# Patient Record
Sex: Male | Born: 1940 | Race: White | Hispanic: No | Marital: Married | Smoking: Never smoker
Health system: Southern US, Community
[De-identification: ages and names within clinical notes are randomized; demographics above are authoritative.]

## PROBLEM LIST (undated history)

## (undated) DIAGNOSIS — M1990 Unspecified osteoarthritis, unspecified site: Secondary | ICD-10-CM

## (undated) DIAGNOSIS — I82402 Acute embolism and thrombosis of unspecified deep veins of left lower extremity: Secondary | ICD-10-CM

## (undated) DIAGNOSIS — N200 Calculus of kidney: Secondary | ICD-10-CM

## (undated) DIAGNOSIS — K219 Gastro-esophageal reflux disease without esophagitis: Secondary | ICD-10-CM

## (undated) DIAGNOSIS — I10 Essential (primary) hypertension: Secondary | ICD-10-CM

## (undated) DIAGNOSIS — I2699 Other pulmonary embolism without acute cor pulmonale: Secondary | ICD-10-CM

## (undated) DIAGNOSIS — K635 Polyp of colon: Secondary | ICD-10-CM

## (undated) HISTORY — DX: Acute embolism and thrombosis of unspecified deep veins of left lower extremity: I82.402

## (undated) HISTORY — PX: COLONOSCOPY: SHX174

## (undated) HISTORY — DX: Other pulmonary embolism without acute cor pulmonale: I26.99

## (undated) HISTORY — PX: LITHOTRIPSY: SUR834

---

## 2005-01-28 ENCOUNTER — Other Ambulatory Visit: Admission: RE | Admit: 2005-01-28 | Discharge: 2005-01-28 | Payer: Self-pay | Admitting: Ophthalmology

## 2007-01-13 ENCOUNTER — Ambulatory Visit: Payer: Self-pay | Admitting: Cardiology

## 2009-11-10 ENCOUNTER — Ambulatory Visit (HOSPITAL_BASED_OUTPATIENT_CLINIC_OR_DEPARTMENT_OTHER): Admission: RE | Admit: 2009-11-10 | Discharge: 2009-11-11 | Payer: Self-pay | Admitting: Urology

## 2010-10-16 LAB — CBC
Hemoglobin: 15.2 g/dL (ref 13.0–17.0)
MCHC: 33.6 g/dL (ref 30.0–36.0)
Platelets: 162 10*3/uL (ref 150–400)

## 2010-10-16 LAB — BASIC METABOLIC PANEL
BUN: 12 mg/dL (ref 6–23)
Calcium: 9.4 mg/dL (ref 8.4–10.5)
Creatinine, Ser: 1.12 mg/dL (ref 0.4–1.5)
GFR calc non Af Amer: >60 mL/min (ref >60)
Potassium: 4.1 mEq/L (ref 3.5–5.1)
Sodium: 137 mEq/L (ref 135–145)

## 2010-10-16 LAB — PROTIME-INR
INR: 1.02 (ref 0.00–1.49)
Prothrombin Time: 13.3 seconds (ref 11.6–15.2)

## 2010-10-16 LAB — URINALYSIS, MICROSCOPIC ONLY
Bilirubin Urine: NEGATIVE
Glucose, UA: NEGATIVE mg/dL
Nitrite: NEGATIVE
Protein, ur: NEGATIVE mg/dL
Specific Gravity, Urine: 1.023 (ref 1.005–1.030)
Urobilinogen, UA: 1 mg/dL (ref 0.0–1.0)
pH: 5.5 (ref 5.0–8.0)

## 2010-10-16 LAB — COMPREHENSIVE METABOLIC PANEL
AST: 19 U/L (ref 0–37)
Calcium: 9.4 mg/dL (ref 8.4–10.5)
Creatinine, Ser: 1.21 mg/dL (ref 0.4–1.5)
GFR calc Af Amer: >60 mL/min (ref >60)
GFR calc non Af Amer: 60 mL/min — ABNORMAL LOW (ref >60)
Potassium: 3.8 mEq/L (ref 3.5–5.1)
Total Bilirubin: 0.8 mg/dL (ref 0.3–1.2)

## 2010-10-16 LAB — HEMOGLOBIN AND HEMATOCRIT, BLOOD: HCT: 43.1 % (ref 39.0–52.0)

## 2011-03-05 DIAGNOSIS — C4491 Basal cell carcinoma of skin, unspecified: Secondary | ICD-10-CM

## 2011-03-05 HISTORY — DX: Basal cell carcinoma of skin, unspecified: C44.91

## 2011-08-06 DIAGNOSIS — R5383 Other fatigue: Secondary | ICD-10-CM | POA: Diagnosis not present

## 2011-08-06 DIAGNOSIS — E782 Mixed hyperlipidemia: Secondary | ICD-10-CM | POA: Diagnosis not present

## 2011-08-06 DIAGNOSIS — N400 Enlarged prostate without lower urinary tract symptoms: Secondary | ICD-10-CM | POA: Diagnosis not present

## 2011-08-13 DIAGNOSIS — Z0000 Encounter for general adult medical examination without abnormal findings: Secondary | ICD-10-CM | POA: Diagnosis not present

## 2011-08-15 ENCOUNTER — Encounter (INDEPENDENT_AMBULATORY_CARE_PROVIDER_SITE_OTHER): Payer: Self-pay | Admitting: *Deleted

## 2011-08-22 ENCOUNTER — Other Ambulatory Visit (INDEPENDENT_AMBULATORY_CARE_PROVIDER_SITE_OTHER): Payer: Self-pay | Admitting: *Deleted

## 2011-08-22 ENCOUNTER — Encounter (INDEPENDENT_AMBULATORY_CARE_PROVIDER_SITE_OTHER): Payer: Self-pay | Admitting: *Deleted

## 2011-08-22 DIAGNOSIS — Z1211 Encounter for screening for malignant neoplasm of colon: Secondary | ICD-10-CM

## 2011-08-22 NOTE — Telephone Encounter (Signed)
This encounter was created in error - please disregard.

## 2011-09-16 DIAGNOSIS — C44319 Basal cell carcinoma of skin of other parts of face: Secondary | ICD-10-CM | POA: Diagnosis not present

## 2011-09-16 DIAGNOSIS — C44611 Basal cell carcinoma of skin of unspecified upper limb, including shoulder: Secondary | ICD-10-CM | POA: Diagnosis not present

## 2011-09-25 ENCOUNTER — Telehealth (INDEPENDENT_AMBULATORY_CARE_PROVIDER_SITE_OTHER): Payer: Self-pay | Admitting: *Deleted

## 2011-09-25 NOTE — Telephone Encounter (Signed)
Requesting MD/PCP:  daniel     Name & DOB: Ronald Valentine  2041/02/13     Procedure: TCS  Reason/Indication:  screening  Has patient had this procedure before?  no  If so, when, by whom and where?    Is there a family history of colon cancer?  no  Who?  What age when diagnosed?    Is patient diabetic?   no      Does patient have prosthetic heart valve?  no  Do you have a pacemaker?  no  Has patient had joint replacement within last 12 months?  no  Is patient on Coumadin, Plavix and/or Aspirin? yes  Medications: asa 81 mg daily, verapamil 240 mg daily, lisinopril/HCTZ 20/12.5 mg daily, omeprazole 20 mg daily, centrum silver, fiber, fish oil, meloxicam 15 mg daily  Allergies: nkda  Pharmacy:   Medication Adjustment:    Procedure date & time: 10/17/11 at 930

## 2011-09-25 NOTE — Telephone Encounter (Signed)
Agree.   Needs to hold ASA

## 2011-10-16 MED ORDER — SODIUM CHLORIDE 0.45 % IV SOLN
Freq: Once | INTRAVENOUS | Status: AC
  Administered 2011-10-17: 09:00:00 via INTRAVENOUS

## 2011-10-17 ENCOUNTER — Encounter (HOSPITAL_COMMUNITY)
Admission: RE | Disposition: A | Discharge status: Home / Self Care | Payer: Self-pay | Source: Ambulatory Visit | Attending: Internal Medicine

## 2011-10-17 ENCOUNTER — Encounter (HOSPITAL_COMMUNITY): Payer: Self-pay | Admitting: *Deleted

## 2011-10-17 ENCOUNTER — Ambulatory Visit (HOSPITAL_COMMUNITY)
Admission: RE | Admit: 2011-10-17 | Discharge: 2011-10-17 | Disposition: A | Discharge status: Home / Self Care | Payer: Medicare Other | Source: Ambulatory Visit | Attending: Internal Medicine | Admitting: Internal Medicine

## 2011-10-17 DIAGNOSIS — D126 Benign neoplasm of colon, unspecified: Secondary | ICD-10-CM | POA: Diagnosis not present

## 2011-10-17 DIAGNOSIS — K644 Residual hemorrhoidal skin tags: Secondary | ICD-10-CM | POA: Insufficient documentation

## 2011-10-17 DIAGNOSIS — I10 Essential (primary) hypertension: Secondary | ICD-10-CM | POA: Insufficient documentation

## 2011-10-17 DIAGNOSIS — Z1211 Encounter for screening for malignant neoplasm of colon: Secondary | ICD-10-CM

## 2011-10-17 DIAGNOSIS — K5730 Diverticulosis of large intestine without perforation or abscess without bleeding: Secondary | ICD-10-CM | POA: Diagnosis not present

## 2011-10-17 DIAGNOSIS — Z79899 Other long term (current) drug therapy: Secondary | ICD-10-CM | POA: Insufficient documentation

## 2011-10-17 DIAGNOSIS — Z860100 Personal history of colon polyps, unspecified: Secondary | ICD-10-CM | POA: Insufficient documentation

## 2011-10-17 DIAGNOSIS — Z7982 Long term (current) use of aspirin: Secondary | ICD-10-CM | POA: Diagnosis not present

## 2011-10-17 HISTORY — DX: Unspecified osteoarthritis, unspecified site: M19.90

## 2011-10-17 HISTORY — DX: Essential (primary) hypertension: I10

## 2011-10-17 HISTORY — DX: Polyp of colon: K63.5

## 2011-10-17 HISTORY — DX: Gastro-esophageal reflux disease without esophagitis: K21.9

## 2011-10-17 HISTORY — PX: COLONOSCOPY: SHX5424

## 2011-10-17 HISTORY — DX: Calculus of kidney: N20.0

## 2011-10-17 SURGERY — COLONOSCOPY
Anesthesia: Moderate Sedation

## 2011-10-17 MED ORDER — MIDAZOLAM HCL 5 MG/5ML IJ SOLN
INTRAMUSCULAR | Status: AC
  Filled 2011-10-17: qty 5

## 2011-10-17 MED ORDER — MEPERIDINE HCL 50 MG/ML IJ SOLN
INTRAMUSCULAR | Status: AC
  Filled 2011-10-17: qty 1

## 2011-10-17 MED ORDER — MEPERIDINE HCL 50 MG/ML IJ SOLN
INTRAMUSCULAR | Status: DC | PRN
  Administered 2011-10-17 (×2): 25 mg via INTRAVENOUS

## 2011-10-17 MED ORDER — MIDAZOLAM HCL 5 MG/5ML IJ SOLN
INTRAMUSCULAR | Status: DC | PRN
  Administered 2011-10-17 (×3): 2 mg via INTRAVENOUS

## 2011-10-17 NOTE — Op Note (Signed)
COLONOSCOPY PROCEDURE REPORT  PATIENT:  Ronald Valentine  MR#:  [id number] Birthdate:  January 30, 1941, 71 y.o., male Endoscopist:  Dr. Malissa Hippo, MD Referred By:  Dr. Donzetta Sprung, MD Procedure Date: 10/17/2011  Procedure:   Colonoscopy with snare polypectomy.  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 His last exam 5 years ago elsewhere was normal.  Informed Consent:  The procedure and risks were reviewed with the patient and informed consent was obtained.  Medications:  Demerol 50 mg IV Versed 6 mg IV  Description of procedure:  After a digital rectal exam was performed, that colonoscope was advanced from the anus through the rectum and colon to the area of the cecum, ileocecal valve and appendiceal orifice. The cecum was deeply intubated. These structures were well-seen and photographed for the record. From the level of the cecum and ileocecal valve, the scope was slowly and cautiously withdrawn. The mucosal surfaces were carefully surveyed utilizing scope tip to flexion to facilitate fold flattening as needed. The scope was pulled down into the rectum where a thorough exam including retroflexion was performed.  Findings:   Prep satisfactory. Moderate number of diverticula and sigmoid colon but few descending colon. 6 mm polyp snared from distal transverse colon. Small linear scar proximal to the dentate line. Small hemorrhoids below the dentate line.  Therapeutic/Diagnostic Maneuvers Performed:  See above  Complications:  None  Cecal Withdrawal Time:  10 minutes  Impression:  Examination performed to cecum. Left-sided diverticulosis. 6 mm polyp snared from distal transverse colon. External hemorrhoids  Recommendations:  Standard instructions given. No aspirin for one week. I will contact patient with biopsy results.  Seniya Stoffers U  10/17/2011 9:56 AM  CC: Dr. Donzetta Sprung, MD, MD & Dr. Bonnetta Barry ref. provider  found

## 2011-10-17 NOTE — Discharge Instructions (Signed)
No aspirin or meloxicam for one week. Resume medications as before. High fiber diet. No driving for 24 hours. Physician will contact him with biopsy results.Colon Polyps A polyp is extra tissue that grows inside your body. Colon polyps grow in the large intestine. The large intestine, also called the colon, is part of your digestive system. It is a long, hollow tube at the end of your digestive tract where your body makes and stores stool. Most polyps are not dangerous. They are benign. This means they are not cancerous. But over time, some types of polyps can turn into cancer. Polyps that are smaller than a pea are usually not harmful. But larger polyps could someday become or may already be cancerous. To be safe, doctors remove all polyps and test them.  WHO GETS POLYPS? Anyone can get polyps, but certain people are more likely than others. You may have a greater chance of getting polyps if:  You are over 50.   You have had polyps before.   Someone in your family has had polyps.   Someone in your family has had cancer of the large intestine.   Find out if someone in your family has had polyps. You may also be more likely to get polyps if you:   Eat a lot of fatty foods.   Smoke.   Drink alcohol.   Do not exercise.   Eat too much.  SYMPTOMS  Most small polyps do not cause symptoms. People often do not know they have one until their caregiver finds it during a regular checkup or while testing them for something else. Some people do have symptoms like these:  Bleeding from the anus. You might notice blood on your underwear or on toilet paper after you have had a bowel movement.   Constipation or diarrhea that lasts more than a week.   Blood in the stool. Blood can make stool look black or it can show up as red streaks in the stool.  If you have any of these symptoms, see your caregiver. HOW DOES THE DOCTOR TEST FOR POLYPS? The doctor can use four tests to check for  polyps:  Digital rectal exam. The caregiver wears gloves and checks your rectum (the last part of the large intestine) to see if it feels normal. This test would find polyps only in the rectum. Your caregiver may need to do one of the other tests listed below to find polyps higher up in the intestine.   Barium enema. The caregiver puts a liquid called barium into your rectum before taking x-rays of your large intestine. Barium makes your intestine look white in the pictures. Polyps are dark, so they are easy to see.   Sigmoidoscopy. With this test, the caregiver can see inside your large intestine. A thin flexible tube is placed into your rectum. The device is called a sigmoidoscope, which has a light and a tiny video camera in it. The caregiver uses the sigmoidoscope to look at the last third of your large intestine.   Colonoscopy. This test is like sigmoidoscopy, but the caregiver looks at all of the large intestine. It usually requires sedation. This is the most common method for finding and removing polyps.  TREATMENT   The caregiver will remove the polyp during sigmoidoscopy or colonoscopy. The polyp is then tested for cancer.   If you have had polyps, your caregiver may want you to get tested regularly in the future.  PREVENTION  There is not one sure way  to prevent polyps. You might be able to lower your risk of getting them if you:  Eat more fruits and vegetables and less fatty food.   Do not smoke.   Avoid alcohol.   Exercise every day.   Lose weight if you are overweight.   Eating more calcium and folate can also lower your risk of getting polyps. Some foods that are rich in calcium are milk, cheese, and broccoli. Some foods that are rich in folate are chickpeas, kidney beans, and spinach.   Aspirin might help prevent polyps. Studies are under way.  Document Released: 04/10/2004 Document Revised: 07/04/2011 Document Reviewed: 09/16/2007 [facility] Patient Information 2012  [place], [place].Colonoscopy Care After Read the instructions outlined below and refer to this sheet in the next few weeks. These discharge instructions provide you with general information on caring for yourself after you leave the hospital. Your doctor may also give you specific instructions. While your treatment has been planned according to the most current medical practices available, unavoidable complications occasionally occur. If you have any problems or questions after discharge, call your doctor. HOME CARE INSTRUCTIONS ACTIVITY:  You may resume your regular activity, but move at a slower pace for the next 24 hours.   Take frequent rest periods for the next 24 hours.   Walking will help get rid of the air and reduce the bloated feeling in your belly (abdomen).   No driving for 24 hours (because of the medicine (anesthesia) used during the test).   You may shower.   Do not sign any important legal documents or operate any machinery for 24 hours (because of the anesthesia used during the test).  NUTRITION:  Drink plenty of fluids.   You may resume your normal diet as instructed by your doctor.   Begin with a light meal and progress to your normal diet. Heavy or fried foods are harder to digest and may make you feel sick to your stomach (nauseated).   Avoid alcoholic beverages for 24 hours or as instructed.  MEDICATIONS:  You may resume your normal medications unless your doctor tells you otherwise.  WHAT TO EXPECT TODAY:  Some feelings of bloating in the abdomen.   Passage of more gas than usual.   Spotting of blood in your stool or on the toilet paper.  IF YOU HAD POLYPS REMOVED DURING THE COLONOSCOPY:  No aspirin products for 7 days or as instructed.   No alcohol for 7 days or as instructed.   Eat a soft diet for the next 24 hours.  FINDING OUT THE RESULTS OF YOUR TEST Not all test results are available during your visit. If your test results are not back during the  visit, make an appointment with your caregiver to find out the results. Do not assume everything is normal if you have not heard from your caregiver or the medical facility. It is important for you to follow up on all of your test results.  SEEK IMMEDIATE MEDICAL CARE IF:  You have more than a spotting of blood in your stool.   Your belly is swollen (abdominal distention).   You are nauseated or vomiting.   You have a fever.   You have abdominal pain or discomfort that is severe or gets worse throughout the day.  Document Released: 02/27/2004 Document Revised: 07/04/2011 Document Reviewed: 02/25/2008 [facility] Patient Information 2012 [place], [place].Hemorrhoids Hemorrhoids are enlarged (dilated) veins around the rectum. There are 2 types of hemorrhoids, and the type of hemorrhoid is determined  by its location. Internal hemorrhoids occur in the veins just inside the rectum.They are usually not painful, but they may bleed.However, they may poke through to the outside and become irritated and painful. External hemorrhoids involve the veins outside the anus and can be felt as a painful swelling or hard lump near the anus.They are often itchy and may crack and bleed. Sometimes clots will form in the veins. This makes them swollen and painful. These are called thrombosed hemorrhoids. CAUSES Causes of hemorrhoids include:  Pregnancy. This increases the pressure in the hemorrhoidal veins.   Constipation.   Straining to have a bowel movement.   Obesity.   Heavy lifting or other activity that caused you to strain.  TREATMENT Most of the time hemorrhoids improve in 1 to 2 weeks. However, if symptoms do not seem to be getting better or if you have a lot of rectal bleeding, your caregiver may perform a procedure to help make the hemorrhoids get smaller or remove them completely.Possible treatments include:  Rubber band ligation. A rubber band is placed at the base of the hemorrhoid to cut off  the circulation.   Sclerotherapy. A chemical is injected to shrink the hemorrhoid.   Infrared light therapy. Tools are used to burn the hemorrhoid.   Hemorrhoidectomy. This is surgical removal of the hemorrhoid.  HOME CARE INSTRUCTIONS   Increase fiber in your diet. Ask your caregiver about using fiber supplements.   Drink enough water and fluids to keep your urine clear or pale yellow.   Exercise regularly.   Go to the bathroom when you have the urge to have a bowel movement. Do not wait.   Avoid straining to have bowel movements.   Keep the anal area dry and clean.   Only take over-the-counter or prescription medicines for pain, discomfort, or fever as directed by your caregiver.  If your hemorrhoids are thrombosed:  Take warm sitz baths for 20 to 30 minutes, 3 to 4 times per day.   If the hemorrhoids are very tender and swollen, place ice packs on the area as tolerated. Using ice packs between sitz baths may be helpful. Fill a plastic bag with ice. Place a towel between the bag of ice and your skin.   Medicated creams and suppositories may be used or applied as directed.   Do not use a donut-shaped pillow or sit on the toilet for long periods. This increases blood pooling and pain.  SEEK MEDICAL CARE IF:   You have increasing pain and swelling that is not controlled with your medicine.   You have uncontrolled bleeding.   You have difficulty or you are unable to have a bowel movement.   You have pain or inflammation outside the area of the hemorrhoids.   You have chills or an oral temperature above 102 F (38.9 C).  MAKE SURE YOU:   Understand these instructions.   Will watch your condition.   Will get help right away if you are not doing well or get worse.  Document Released: 07/12/2000 Document Revised: 07/04/2011 Document Reviewed: 11/17/2007 [facility] Patient Information 2012 [place], [place].

## 2011-10-17 NOTE — H&P (Signed)
Ronald Valentine is an 71 y.o. male.   Chief Complaint: Patient is here for colonoscopy. 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 His last exam was 5 years [facility] in [place] and reportedly was normal. He is here for surveillance colonoscopy. He denies melena or rectal bleeding abdominal pain or change in his bowel habits. History is negative for colorectal carcinoma   Past Medical History  Diagnosis Date  . Hypertension   . GERD (gastroesophageal reflux disease)   . Arthritis   . Colon polyp   . Kidney stones     Past Surgical History  Procedure Date  . Colonoscopy   . Lithotripsy     Family History  Problem Relation Age of Onset  . Colon cancer Neg Hx    Social History:  reports that he has never smoked. He does not have any smokeless tobacco history on file. He reports that he does not drink alcohol or use illicit drugs.  Allergies: No Known Allergies  Medications Prior to Admission  Medication Dose Route Frequency Provider Last Rate Last Dose  . 0.45 % sodium chloride infusion   Intravenous Once Malissa Hippo, MD 20 mL/hr at 10/17/11 [phone]    . meperidine (DEMEROL) 50 MG/ML injection           . midazolam (VERSED) 5 MG/5ML injection            Medications Prior to Admission  Medication Sig Dispense Refill  . aspirin 81 MG tablet Take 81 mg by mouth daily.      Marland Kitchen lisinopril-hydrochlorothiazide (PRINZIDE,ZESTORETIC) 20-12.5 MG per tablet Take 1 tablet by mouth daily.      . meloxicam (MOBIC) 15 MG tablet Take 15 mg by mouth daily.      . Multiple Vitamins-Minerals (CENTRUM SILVER) tablet Take 1 tablet by mouth daily.      . Omega-3 Fatty Acids (FISH OIL) 1200 MG CAPS Take 1 capsule by mouth daily.      Marland Kitchen omeprazole (PRILOSEC) 20 MG capsule Take 20 mg by mouth daily.      . verapamil (CALAN-SR) 240 MG CR tablet Take 240 mg by mouth daily.        No results found for this or any previous visit (from the past  48 hour(s)). No results found.  Review of Systems  Constitutional: Negative for weight loss.  Gastrointestinal: Negative for abdominal pain, diarrhea, constipation, blood in stool and melena.    Blood pressure 146/79, pulse 73, temperature 97.4 F (36.3 C), temperature source Oral, resp. rate 21, height 6\' 2"  (1.88 m), weight 218 lb (98.884 kg), SpO2 96.00%. Physical Exam  Constitutional: He appears well-developed and well-nourished.  HENT:  Mouth/Throat: Oropharynx is clear and moist.  Eyes: Conjunctivae are normal. No scleral icterus.  Neck: No thyromegaly present.  Cardiovascular: Normal rate, regular rhythm and normal heart sounds.   No murmur heard. Respiratory: Effort normal and breath sounds normal.  GI: Soft. He exhibits no distension and no mass. There is no tenderness.  Musculoskeletal: He exhibits no edema.  Lymphadenopathy:    He has no cervical adenopathy.  Neurological: He is alert.  Skin: Skin is warm.     Assessment/Plan History of colonic polyps. Surveillance colonoscopy  Ronald Valentine U 10/17/2011, 9:25 AM

## 2011-10-21 ENCOUNTER — Encounter (HOSPITAL_COMMUNITY): Payer: Self-pay | Admitting: Internal Medicine

## 2011-10-24 ENCOUNTER — Encounter (INDEPENDENT_AMBULATORY_CARE_PROVIDER_SITE_OTHER): Payer: Self-pay | Admitting: *Deleted

## 2011-10-31 ENCOUNTER — Other Ambulatory Visit (INDEPENDENT_AMBULATORY_CARE_PROVIDER_SITE_OTHER): Payer: Self-pay | Admitting: *Deleted

## 2011-10-31 ENCOUNTER — Encounter (INDEPENDENT_AMBULATORY_CARE_PROVIDER_SITE_OTHER): Payer: Self-pay | Admitting: *Deleted

## 2011-10-31 DIAGNOSIS — D132 Benign neoplasm of duodenum: Secondary | ICD-10-CM

## 2011-11-25 DIAGNOSIS — C44319 Basal cell carcinoma of skin of other parts of face: Secondary | ICD-10-CM | POA: Diagnosis not present

## 2011-11-27 ENCOUNTER — Telehealth (INDEPENDENT_AMBULATORY_CARE_PROVIDER_SITE_OTHER): Payer: Self-pay | Admitting: *Deleted

## 2011-11-27 NOTE — Telephone Encounter (Signed)
PCP/Requesting MD: daniel  Name & DOB: Ronald Valentine [birth year]     Procedure: egd  Reason/Indication:  Duodenal adenoma  Has patient had this procedure before?  yes  If so, when, by whom and where?  6/11  Is there a family history of colon cancer?  no  Who?  What age when diagnosed?    Is patient diabetic?   no      Does patient have prosthetic heart valve?  no  Do you have a pacemaker?  no  Has patient had joint replacement within last 12 months?  no  Is patient on Coumadin, Plavix and/or Aspirin? yes  Medications: see EPIC  Allergies: nkda  Medication Adjustment: asa 2 dys  Procedure date & time: 12/26/11

## 2011-11-28 NOTE — Telephone Encounter (Signed)
agree

## 2011-12-05 DIAGNOSIS — E7800 Pure hypercholesterolemia, unspecified: Secondary | ICD-10-CM | POA: Diagnosis not present

## 2011-12-05 DIAGNOSIS — Z79899 Other long term (current) drug therapy: Secondary | ICD-10-CM | POA: Diagnosis not present

## 2011-12-10 DIAGNOSIS — E782 Mixed hyperlipidemia: Secondary | ICD-10-CM | POA: Diagnosis not present

## 2011-12-10 DIAGNOSIS — I10 Essential (primary) hypertension: Secondary | ICD-10-CM | POA: Diagnosis not present

## 2011-12-10 DIAGNOSIS — N529 Male erectile dysfunction, unspecified: Secondary | ICD-10-CM | POA: Diagnosis not present

## 2011-12-10 DIAGNOSIS — N200 Calculus of kidney: Secondary | ICD-10-CM | POA: Diagnosis not present

## 2011-12-10 DIAGNOSIS — M1990 Unspecified osteoarthritis, unspecified site: Secondary | ICD-10-CM | POA: Diagnosis not present

## 2011-12-10 DIAGNOSIS — I80299 Phlebitis and thrombophlebitis of other deep vessels of unspecified lower extremity: Secondary | ICD-10-CM | POA: Diagnosis not present

## 2011-12-24 ENCOUNTER — Encounter (HOSPITAL_COMMUNITY): Payer: Self-pay | Admitting: Pharmacy Technician

## 2011-12-25 MED ORDER — SODIUM CHLORIDE 0.45 % IV SOLN
Freq: Once | INTRAVENOUS | Status: AC
  Administered 2011-12-26: 1000 mL via INTRAVENOUS

## 2011-12-26 ENCOUNTER — Telehealth (INDEPENDENT_AMBULATORY_CARE_PROVIDER_SITE_OTHER): Payer: Self-pay | Admitting: *Deleted

## 2011-12-26 ENCOUNTER — Encounter (HOSPITAL_COMMUNITY)
Admission: RE | Disposition: A | Discharge status: Home / Self Care | Payer: Self-pay | Source: Ambulatory Visit | Attending: Internal Medicine

## 2011-12-26 ENCOUNTER — Ambulatory Visit (HOSPITAL_COMMUNITY)
Admission: RE | Admit: 2011-12-26 | Discharge: 2011-12-26 | Disposition: A | Discharge status: Home / Self Care | Payer: Medicare Other | Source: Ambulatory Visit | Attending: Internal Medicine | Admitting: Internal Medicine

## 2011-12-26 ENCOUNTER — Encounter (HOSPITAL_COMMUNITY): Payer: Self-pay | Admitting: *Deleted

## 2011-12-26 DIAGNOSIS — Z860100 Personal history of colon polyps, unspecified: Secondary | ICD-10-CM | POA: Insufficient documentation

## 2011-12-26 DIAGNOSIS — K2960 Other gastritis without bleeding: Secondary | ICD-10-CM

## 2011-12-26 DIAGNOSIS — Z79899 Other long term (current) drug therapy: Secondary | ICD-10-CM | POA: Diagnosis not present

## 2011-12-26 DIAGNOSIS — I10 Essential (primary) hypertension: Secondary | ICD-10-CM | POA: Insufficient documentation

## 2011-12-26 DIAGNOSIS — K2990 Gastroduodenitis, unspecified, without bleeding: Secondary | ICD-10-CM | POA: Insufficient documentation

## 2011-12-26 DIAGNOSIS — Z8719 Personal history of other diseases of the digestive system: Secondary | ICD-10-CM | POA: Diagnosis not present

## 2011-12-26 DIAGNOSIS — K2970 Gastritis, unspecified, without bleeding: Secondary | ICD-10-CM | POA: Diagnosis not present

## 2011-12-26 DIAGNOSIS — D132 Benign neoplasm of duodenum: Secondary | ICD-10-CM

## 2011-12-26 DIAGNOSIS — Z09 Encounter for follow-up examination after completed treatment for conditions other than malignant neoplasm: Secondary | ICD-10-CM | POA: Diagnosis not present

## 2011-12-26 DIAGNOSIS — K222 Esophageal obstruction: Secondary | ICD-10-CM | POA: Insufficient documentation

## 2011-12-26 DIAGNOSIS — D1330 Benign neoplasm of unspecified part of small intestine: Secondary | ICD-10-CM | POA: Diagnosis not present

## 2011-12-26 DIAGNOSIS — K228 Other specified diseases of esophagus: Secondary | ICD-10-CM | POA: Diagnosis not present

## 2011-12-26 DIAGNOSIS — K219 Gastro-esophageal reflux disease without esophagitis: Secondary | ICD-10-CM

## 2011-12-26 HISTORY — PX: ESOPHAGOGASTRODUODENOSCOPY: SHX5428

## 2011-12-26 SURGERY — EGD (ESOPHAGOGASTRODUODENOSCOPY)
Anesthesia: Moderate Sedation

## 2011-12-26 MED ORDER — BUTAMBEN-TETRACAINE-BENZOCAINE 2-2-14 % EX AERO
INHALATION_SPRAY | CUTANEOUS | Status: DC | PRN
  Administered 2011-12-26: 2 via TOPICAL

## 2011-12-26 MED ORDER — PANTOPRAZOLE SODIUM 40 MG PO TBEC: 40.0000 mg | DELAYED_RELEASE_TABLET | Freq: Every day | ORAL | Status: DC

## 2011-12-26 MED ORDER — MIDAZOLAM HCL 5 MG/5ML IJ SOLN
INTRAMUSCULAR | Status: AC
  Filled 2011-12-26: qty 10

## 2011-12-26 MED ORDER — MEPERIDINE HCL 50 MG/ML IJ SOLN
INTRAMUSCULAR | Status: AC
  Filled 2011-12-26: qty 1

## 2011-12-26 MED ORDER — STERILE WATER FOR IRRIGATION IR SOLN
Status: DC | PRN
  Administered 2011-12-26: 08:00:00

## 2011-12-26 MED ORDER — MEPERIDINE HCL 25 MG/ML IJ SOLN
INTRAMUSCULAR | Status: DC | PRN
  Administered 2011-12-26 (×2): 25 mg via INTRAVENOUS

## 2011-12-26 MED ORDER — MIDAZOLAM HCL 5 MG/5ML IJ SOLN
INTRAMUSCULAR | Status: DC | PRN
  Administered 2011-12-26: 1 mg via INTRAVENOUS
  Administered 2011-12-26 (×2): 2 mg via INTRAVENOUS
  Administered 2011-12-26: 1 mg via INTRAVENOUS

## 2011-12-26 NOTE — Discharge Instructions (Signed)
Stop omeprazole or Prilosec. Start pantoprazole 40 mg by mouth every morning 30 minutes before breakfast. Resume other medications as before. No driving for 24 hours. Physician will contact you with biopsy results.  Endoscopy Care After Please read the instructions outlined below and refer to this sheet in the next few weeks. These discharge instructions provide you with general information on caring for yourself after you leave the hospital. Your doctor may also give you specific instructions. While your treatment has been planned according to the most current medical practices available, unavoidable complications occasionally occur. If you have any problems or questions after discharge, please call your doctor. HOME CARE INSTRUCTIONS Activity  You may resume your regular activity but move at a slower pace for the next 24 hours.   Take frequent rest periods for the next 24 hours.   Walking will help expel (get rid of) the air and reduce the bloated feeling in your abdomen.   No driving for 24 hours (because of the anesthesia (medicine) used during the test).   You may shower.   Do not sign any important legal documents or operate any machinery for 24 hours (because of the anesthesia used during the test).  Nutrition  Drink plenty of fluids.   You may resume your normal diet.   Begin with a light meal and progress to your normal diet.   Avoid alcoholic beverages for 24 hours or as instructed by your caregiver.  Medications You may resume your normal medications unless your caregiver tells you otherwise. What you can expect today  You may experience abdominal discomfort such as a feeling of fullness or "gas" pains.   You may experience a sore throat for 2 to 3 days. This is normal. Gargling with salt water may help this.  Follow-up Your doctor will discuss the results of your test with you. SEEK IMMEDIATE MEDICAL CARE IF:  You have excessive nausea (feeling sick to your  stomach) and/or vomiting.   You have severe abdominal pain and distention (swelling).   You have trouble swallowing.   You have a temperature over 100 F (37.8 C).   You have rectal bleeding or vomiting of blood.  Document Released: 02/27/2004 Document Revised: 07/04/2011 Document Reviewed: 09/09/2007 [facility] Patient Information 2012 [place], [place].  Gastritis Gastritis is an inflammation (the body's way of reacting to injury and/or infection) of the stomach. It is often caused by viral or bacterial (germ) infections. It can also be caused by chemicals (including alcohol) and medications. This illness may be associated with generalized malaise (feeling tired, not well), cramps, and fever. The illness may last 2 to 7 days. If symptoms of gastritis continue, gastroscopy (looking into the stomach with a telescope-like instrument), biopsy (taking tissue samples), and/or blood tests may be necessary to determine the cause. Antibiotics will not affect the illness unless there is a bacterial infection present. One common bacterial cause of gastritis is an organism known as H. Pylori. This can be treated with antibiotics. Other forms of gastritis are caused by too much acid in the stomach. They can be treated with medications such as H2 blockers and antacids. Home treatment is usually all that is needed. Young children will quickly become dehydrated (loss of body fluids) if vomiting and diarrhea are both present. Medications may be given to control nausea. Medications are usually not given for diarrhea unless especially bothersome. Some medications slow the removal of the virus from the gastrointestinal tract. This slows down the healing process. HOME CARE INSTRUCTIONS Home  care instructions for nausea and vomiting:  For adults: drink small amounts of fluids often. Drink at least 2 quarts a day. Take sips frequently. Do not drink large amounts of fluid at one time. This may worsen the nausea.   Only  take over-the-counter or prescription medicines for pain, discomfort, or fever as directed by your caregiver.   Drink clear liquids only. Those are anything you can see through such as water, broth, or soft drinks.   Once you are keeping clear liquids down, you may start full liquids, soups, juices, and ice cream or sherbet. Slowly add bland (plain, not spicy) foods to your diet.  Home care instructions for diarrhea:  Diarrhea can be caused by bacterial infections or a virus. Your condition should improve with time, rest, fluids, and/or anti-diarrheal medication.   Until your diarrhea is under control, you should drink clear liquids often in small amounts. Clear liquids include: water, broth, jell-o water and weak tea.  Avoid:  Milk.   Fruits.   Tobacco.   Alcohol.   Extremely hot or cold fluids.   Too much intake of anything at one time.  When your diarrhea stops you may add the following foods, which help the stool to become more formed:  Rice.   Bananas.   Apples without skin.   Dry toast.  Once these foods are tolerated you may add low-fat yogurt and low-fat cottage cheese. They will help to restore the normal bacterial balance in your bowel. Wash your hands well to avoid spreading bacteria (germ) or virus. SEEK IMMEDIATE MEDICAL CARE IF:   You are unable to keep fluids down.   Vomiting or diarrhea become persistent (constant).   Abdominal pain develops, increases, or localizes. (Right sided pain can be appendicitis. Left sided pain in adults can be diverticulitis.)   You develop a fever (an oral temperature above 102 F (38.9 C)).   Diarrhea becomes excessive or contains blood or mucus.   You have excessive weakness, dizziness, fainting or extreme thirst.   You are not improving or you are getting worse.   You have any other questions or concerns.  Document Released: 07/09/2001 Document Revised: 07/04/2011 Document Reviewed: 07/15/2005 [facility] Patient  Information 2012 [place], [place].  Esophageal Stricture The esophagus is the long, narrow tube which carries food and liquid from the mouth to the stomach. Sometimes a part of the esophagus becomes narrow and makes it difficult, painful, or even impossible to swallow. This is called an esophageal stricture.  CAUSES  Common causes of blockage or strictures of the esophagus are:  Exposure of the lower esophagus to the acid from the stomach may cause narrowing.   Hiatal hernia in which a small part of the stomach bulges up through the diaphragm can cause a narrowing in the bottom of the esophagus.   Scleroderma is a tissue disorder that affects the esophagus and makes swallowing difficult.   Achalasia is an absence of nerves in the lower esophagus and to the esophageal sphincter. This absence of nerves may be congenital (present since birth). This can cause irregular spasms which do not allow food and fluid through.   Strictures may develop from swallowing materials which damage the esophagus. Examples are acids or alkalis such as lye.   Schatzki's Ring is a narrow ring of non-cancerous tissue which narrows the lower esophagus. The cause of this is unknown.   Growths can block the esophagus.  SYMPTOMS  Some of the problems are difficulty swallowing or pain with swallowing.  DIAGNOSIS  Your caregiver often suspects this problem by taking a medical history. They will also do a physical exam. They may then take X-rays and/or perform an endoscopy. Endoscopy is an exam in which a tube like a small flexible telescope is used to look at your esophagus.  TREATMENT  One form of treatment is to dilate the narrow area. This means to stretch it.   When this is not successful, chest surgery may be required. This is a much more extensive form of treatment with a longer recovery time.  Both of the above treatments make the passage of food and water into the stomach easier. They also make it easier for  stomach contents to bubble back into the esophagus. Special medications may be used following the procedure to help prevent further narrowing. Medications may be used to lower the amount of acid in the stomach juice.  SEEK IMMEDIATE MEDICAL CARE IF:   Your swallowing is becoming more painful, difficult, or you are unable to swallow.   You vomit up blood.   You develop black tarry stools.   You develop chills.   You have a fever.   You develop chest or abdominal pain.   You develop shortness of breath, feel lightheaded, or faint.  Follow up with medical care as your caregiver suggests. Document Released: 03/25/2006 Document Revised: 07/04/2011 Document Reviewed: 05/01/2006 [facility] Patient Information 2012 [place], [place].

## 2011-12-26 NOTE — Op Note (Signed)
EGD PROCEDURE REPORT  PATIENT:  Ronald Valentine  MR#:  [id number] Birthdate:  09-Apr-1941, 71 y.o., male Endoscopist:  Dr. Malissa Hippo, MD Referred By:  Dr. Bonnetta Barry ref. provider found Procedure Date: 12/26/2011  Procedure:   EGD with ED.  Indications:  Patient is 71 year old Caucasian male with history of duodenal adenoma. His last exam was over two years ago at [facility] in [facility].            Informed Consent:  The risks, benefits, alternatives & imponderables which include, but are not limited to, bleeding, infection, perforation, drug reaction and potential missed lesion have been reviewed.  The potential for biopsy, lesion removal, esophageal dilation, etc. have also been discussed.  Questions have been answered.  All parties agreeable.  Please see history & physical in medical record for more information.  Medications:  Demerol 50 mg IV Versed 6 mg IV Cetacaine spray topically for oropharyngeal anesthesia  Description of procedure:  The endoscope was introduced through the mouth and advanced to the second portion of the duodenum without difficulty or limitations. The mucosal surfaces were surveyed very carefully during advancement of the scope and upon withdrawal.  Findings:  Esophagus:  Mucosa of the esophagus was normal. Soft stricture noted at GE junction with edema and erythema. GEJ:  40 cm Hiatus:  42 cm Stomach:  Stomach was empty and distended very well with insufflation. Folds in the proximal stomach were normal. Examination mucosa at body was normal. Scattered erosions are noted at antrum. Pyloric channel was patent. Angularis fundus and cardia were examined by retroflexing the scope and were normal. Duodenum:  Bulbar mucosa was normal. Scar noted at second part of the duodenum site of previous polypectomy. Focal abnormality to mucosa next the scar. It was biopsied for histology. Ampulla of Vater was well-seen and was normal.  Therapeutic/Diagnostic Maneuvers Performed:   Duodenal biopsy as above. Esophagus was dilated by passing 54 [place] Maloney dilator resulting in mucosal disruption at GE junction. Post dilation I was able to pass scope across the GE junction without any resistance.  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 Stricture dilated by passing 54 [place] Maloney dilator. Please note patient did not give history of dysphagia but I was concerned that he would start having dysphagia soon. Erosive gastritis possibly secondary to NSAID. 4 mm island of abnormal appearing duodenal mucosa next the scar. Endoscopically it did not appear to be an adenoma. It was biopsied for histology.  Recommendations:  Discontinue omeprazole. Start pantoprazole 40 mg by mouth every morning. Will check old records to make sure he has had H. pylori testing. I will contact patient with biopsy results.  Arhaan Chesnut U  12/26/2011  8:00 AM  CC: Dr. Donzetta Sprung, MD, MD & Dr. Bonnetta Barry ref. provider found

## 2011-12-26 NOTE — H&P (Signed)
Ronald Valentine is an 71 y.o. male.   Chief Complaint: Patient is here for esophagogastroduodenoscopy. HPI: Patient is 71 year old Caucasian male with history of duodenal adenoma. His last exam was 2 years ago [facility] in [facility] he denies nausea vomiting abdominal pain melena. He has good appetite.  Past Medical History  Diagnosis Date  . Hypertension   . GERD (gastroesophageal reflux disease)   . Arthritis   . Colon polyp   . Kidney stones     Past Surgical History  Procedure Date  . Colonoscopy   . Lithotripsy   . Colonoscopy 10/17/2011    Procedure: COLONOSCOPY;  Surgeon: Malissa Hippo, MD;  Location: [facility];  Service: Endoscopy;  Laterality: N/A;  930    Family History  Problem Relation Age of Onset  . Colon cancer Neg Hx    Social History:  reports that he has never smoked. He does not have any smokeless tobacco history on file. He reports that he does not drink alcohol or use illicit drugs.  Allergies: No Known Allergies  Medications Prior to Admission  Medication Sig Dispense Refill  . lisinopril-hydrochlorothiazide (PRINZIDE,ZESTORETIC) 20-12.5 MG per tablet Take 1 tablet by mouth daily.      . meloxicam (MOBIC) 15 MG tablet Take 15 mg by mouth daily.      . Multiple Vitamins-Minerals (CENTRUM SILVER) tablet Take 1 tablet by mouth daily.      . Omega-3 Fatty Acids (FISH OIL) 1200 MG CAPS Take 1 capsule by mouth daily.      Marland Kitchen omeprazole (PRILOSEC) 20 MG capsule Take 20 mg by mouth daily.      . Tamsulosin HCl (FLOMAX) 0.4 MG CAPS Take 0.4 mg by mouth daily.      . verapamil (CALAN-SR) 240 MG CR tablet Take 240 mg by mouth daily.      Marland Kitchen aspirin EC 81 MG tablet Take 81 mg by mouth daily.        No results found for this or any previous visit (from the past 48 hour(s)). No results found.  ROS  Blood pressure 146/84, pulse 67, temperature 97.7 F (36.5 C), temperature source Oral, resp. rate 20, height 6\' 2"  (1.88 m), weight 218  lb (98.884 kg), SpO2 94.00%. Physical Exam   Assessment/Plan History of duodenal adenoma. EGD.  Ishmeal Rorie U 12/26/2011, 7:33 AM

## 2011-12-26 NOTE — Telephone Encounter (Addendum)
Patient had procedure today & Dr Karilyn Cota wanted him to have Pantoprazole 40 mg.  Apparently pharmacy was wrong in EPIC and I have taken that out, the correct pharmacy was not in EPIC so it needs to be sent to [place], [place] ph# [phone] & fax# [phone] -- His acct# for [facility] is [id number] --  don't use [facility] in computer, it is the wrong pharmacy..   If you have any questions patient ph# is [phone]

## 2011-12-31 ENCOUNTER — Encounter (HOSPITAL_COMMUNITY): Payer: Self-pay | Admitting: Internal Medicine

## 2011-12-31 ENCOUNTER — Encounter (INDEPENDENT_AMBULATORY_CARE_PROVIDER_SITE_OTHER): Payer: Self-pay | Admitting: *Deleted

## 2012-01-01 NOTE — Telephone Encounter (Signed)
The following medication was called to the [facility] at [phone] referring to reference number #[id number]: Pantoprazole 40 mg take 1 by  Mouth every morning #90 with 3 refills, this was given to the Pharmacist [place]. Patient was called and made aware , message left on his voice mail. Note the Pharmacist stated that there was a shortage on this medication and as soon as they had enough they would contact the patient to make him aware of shipment date, also, stated that this would not take long to get in.

## 2012-01-07 ENCOUNTER — Encounter (INDEPENDENT_AMBULATORY_CARE_PROVIDER_SITE_OTHER): Payer: Self-pay

## 2012-03-24 DIAGNOSIS — E782 Mixed hyperlipidemia: Secondary | ICD-10-CM | POA: Diagnosis not present

## 2012-03-24 DIAGNOSIS — I10 Essential (primary) hypertension: Secondary | ICD-10-CM | POA: Diagnosis not present

## 2012-04-21 DIAGNOSIS — N200 Calculus of kidney: Secondary | ICD-10-CM | POA: Diagnosis not present

## 2012-04-21 DIAGNOSIS — I10 Essential (primary) hypertension: Secondary | ICD-10-CM | POA: Diagnosis not present

## 2012-04-21 DIAGNOSIS — M1990 Unspecified osteoarthritis, unspecified site: Secondary | ICD-10-CM | POA: Diagnosis not present

## 2012-04-21 DIAGNOSIS — E782 Mixed hyperlipidemia: Secondary | ICD-10-CM | POA: Diagnosis not present

## 2012-04-21 DIAGNOSIS — Z23 Encounter for immunization: Secondary | ICD-10-CM | POA: Diagnosis not present

## 2012-04-21 DIAGNOSIS — N529 Male erectile dysfunction, unspecified: Secondary | ICD-10-CM | POA: Diagnosis not present

## 2012-04-21 DIAGNOSIS — I80299 Phlebitis and thrombophlebitis of other deep vessels of unspecified lower extremity: Secondary | ICD-10-CM | POA: Diagnosis not present

## 2012-09-03 DIAGNOSIS — D239 Other benign neoplasm of skin, unspecified: Secondary | ICD-10-CM | POA: Diagnosis not present

## 2012-09-03 DIAGNOSIS — L570 Actinic keratosis: Secondary | ICD-10-CM | POA: Diagnosis not present

## 2012-09-03 DIAGNOSIS — L821 Other seborrheic keratosis: Secondary | ICD-10-CM | POA: Diagnosis not present

## 2012-09-08 DIAGNOSIS — M1990 Unspecified osteoarthritis, unspecified site: Secondary | ICD-10-CM | POA: Diagnosis not present

## 2012-09-08 DIAGNOSIS — E782 Mixed hyperlipidemia: Secondary | ICD-10-CM | POA: Diagnosis not present

## 2012-09-08 DIAGNOSIS — I10 Essential (primary) hypertension: Secondary | ICD-10-CM | POA: Diagnosis not present

## 2012-09-08 DIAGNOSIS — Z0000 Encounter for general adult medical examination without abnormal findings: Secondary | ICD-10-CM | POA: Diagnosis not present

## 2012-12-28 ENCOUNTER — Telehealth (INDEPENDENT_AMBULATORY_CARE_PROVIDER_SITE_OTHER): Payer: Self-pay | Admitting: *Deleted

## 2012-12-28 ENCOUNTER — Encounter (INDEPENDENT_AMBULATORY_CARE_PROVIDER_SITE_OTHER): Payer: Self-pay | Admitting: *Deleted

## 2012-12-28 NOTE — Telephone Encounter (Signed)
Per Dr.Rehman , may call in a prescription for Pantoprazole 40 mg - take 1 by mouth daily #30 11 refills to [facility] in [place]

## 2012-12-28 NOTE — Telephone Encounter (Signed)
Patient's mail order pharmacy is longer carrying the Pantoprazole. He would like to know if Dr. Karilyn Cota would like to change this to something else or send a new Rx for the Pantoprazole to [facility] in [place]. His return phone number is [phone].

## 2012-12-28 NOTE — Telephone Encounter (Signed)
Correction the patient may have #30 with 5 refills

## 2012-12-29 DIAGNOSIS — K2100 Gastro-esophageal reflux disease with esophagitis, without bleeding: Secondary | ICD-10-CM | POA: Diagnosis not present

## 2012-12-29 DIAGNOSIS — N200 Calculus of kidney: Secondary | ICD-10-CM | POA: Diagnosis not present

## 2012-12-29 DIAGNOSIS — I10 Essential (primary) hypertension: Secondary | ICD-10-CM | POA: Diagnosis not present

## 2012-12-29 DIAGNOSIS — E782 Mixed hyperlipidemia: Secondary | ICD-10-CM | POA: Diagnosis not present

## 2012-12-29 DIAGNOSIS — I80299 Phlebitis and thrombophlebitis of other deep vessels of unspecified lower extremity: Secondary | ICD-10-CM | POA: Diagnosis not present

## 2012-12-29 DIAGNOSIS — M1990 Unspecified osteoarthritis, unspecified site: Secondary | ICD-10-CM | POA: Diagnosis not present

## 2012-12-29 DIAGNOSIS — N529 Male erectile dysfunction, unspecified: Secondary | ICD-10-CM | POA: Diagnosis not present

## 2012-12-29 DIAGNOSIS — N400 Enlarged prostate without lower urinary tract symptoms: Secondary | ICD-10-CM | POA: Diagnosis not present

## 2012-12-29 NOTE — Telephone Encounter (Signed)
Prescription was called to [facility] in [place] as follows - Pantoprazole 40 mg take 1 by mouth every morning #30 5 refills. Patient will need a office visit per Dr.Rehman before further refills.

## 2012-12-31 NOTE — Telephone Encounter (Signed)
Apt has been scheduled for 03/23/13 at 10:15 am with Dr. Karilyn Cota. Patient advised and voices understood.

## 2013-01-11 DIAGNOSIS — I10 Essential (primary) hypertension: Secondary | ICD-10-CM | POA: Diagnosis not present

## 2013-01-11 DIAGNOSIS — E782 Mixed hyperlipidemia: Secondary | ICD-10-CM | POA: Diagnosis not present

## 2013-01-11 DIAGNOSIS — M1990 Unspecified osteoarthritis, unspecified site: Secondary | ICD-10-CM | POA: Diagnosis not present

## 2013-02-12 DIAGNOSIS — R079 Chest pain, unspecified: Secondary | ICD-10-CM | POA: Diagnosis not present

## 2013-02-12 DIAGNOSIS — R0602 Shortness of breath: Secondary | ICD-10-CM | POA: Diagnosis not present

## 2013-02-13 DIAGNOSIS — R319 Hematuria, unspecified: Secondary | ICD-10-CM | POA: Diagnosis present

## 2013-02-13 DIAGNOSIS — I519 Heart disease, unspecified: Secondary | ICD-10-CM | POA: Diagnosis not present

## 2013-02-13 DIAGNOSIS — N179 Acute kidney failure, unspecified: Secondary | ICD-10-CM | POA: Diagnosis not present

## 2013-02-13 DIAGNOSIS — I2789 Other specified pulmonary heart diseases: Secondary | ICD-10-CM | POA: Diagnosis not present

## 2013-02-13 DIAGNOSIS — N289 Disorder of kidney and ureter, unspecified: Secondary | ICD-10-CM | POA: Diagnosis not present

## 2013-02-13 DIAGNOSIS — Z9889 Other specified postprocedural states: Secondary | ICD-10-CM | POA: Diagnosis not present

## 2013-02-13 DIAGNOSIS — I509 Heart failure, unspecified: Secondary | ICD-10-CM | POA: Diagnosis not present

## 2013-02-13 DIAGNOSIS — M25519 Pain in unspecified shoulder: Secondary | ICD-10-CM | POA: Diagnosis not present

## 2013-02-13 DIAGNOSIS — I82819 Embolism and thrombosis of superficial veins of unspecified lower extremities: Secondary | ICD-10-CM | POA: Diagnosis not present

## 2013-02-13 DIAGNOSIS — I10 Essential (primary) hypertension: Secondary | ICD-10-CM | POA: Diagnosis not present

## 2013-02-13 DIAGNOSIS — I82409 Acute embolism and thrombosis of unspecified deep veins of unspecified lower extremity: Secondary | ICD-10-CM | POA: Diagnosis not present

## 2013-02-13 DIAGNOSIS — G4700 Insomnia, unspecified: Secondary | ICD-10-CM | POA: Diagnosis present

## 2013-02-13 DIAGNOSIS — R0902 Hypoxemia: Secondary | ICD-10-CM | POA: Diagnosis not present

## 2013-02-13 DIAGNOSIS — K219 Gastro-esophageal reflux disease without esophagitis: Secondary | ICD-10-CM | POA: Diagnosis present

## 2013-02-13 DIAGNOSIS — Z86718 Personal history of other venous thrombosis and embolism: Secondary | ICD-10-CM | POA: Diagnosis not present

## 2013-02-13 DIAGNOSIS — I5032 Chronic diastolic (congestive) heart failure: Secondary | ICD-10-CM | POA: Diagnosis not present

## 2013-02-13 DIAGNOSIS — I2699 Other pulmonary embolism without acute cor pulmonale: Secondary | ICD-10-CM | POA: Diagnosis not present

## 2013-02-23 DIAGNOSIS — I80299 Phlebitis and thrombophlebitis of other deep vessels of unspecified lower extremity: Secondary | ICD-10-CM | POA: Diagnosis not present

## 2013-02-24 DIAGNOSIS — I80299 Phlebitis and thrombophlebitis of other deep vessels of unspecified lower extremity: Secondary | ICD-10-CM | POA: Diagnosis not present

## 2013-02-26 DIAGNOSIS — I80299 Phlebitis and thrombophlebitis of other deep vessels of unspecified lower extremity: Secondary | ICD-10-CM | POA: Diagnosis not present

## 2013-03-01 DIAGNOSIS — I80299 Phlebitis and thrombophlebitis of other deep vessels of unspecified lower extremity: Secondary | ICD-10-CM | POA: Diagnosis not present

## 2013-03-03 DIAGNOSIS — I2699 Other pulmonary embolism without acute cor pulmonale: Secondary | ICD-10-CM | POA: Diagnosis not present

## 2013-03-03 DIAGNOSIS — I80299 Phlebitis and thrombophlebitis of other deep vessels of unspecified lower extremity: Secondary | ICD-10-CM | POA: Diagnosis not present

## 2013-03-08 ENCOUNTER — Telehealth (INDEPENDENT_AMBULATORY_CARE_PROVIDER_SITE_OTHER): Payer: Self-pay | Admitting: *Deleted

## 2013-03-08 ENCOUNTER — Other Ambulatory Visit (INDEPENDENT_AMBULATORY_CARE_PROVIDER_SITE_OTHER): Payer: Self-pay | Admitting: Internal Medicine

## 2013-03-08 DIAGNOSIS — I80299 Phlebitis and thrombophlebitis of other deep vessels of unspecified lower extremity: Secondary | ICD-10-CM | POA: Diagnosis not present

## 2013-03-08 MED ORDER — PANTOPRAZOLE SODIUM 40 MG PO TBEC: 40.0000 mg | DELAYED_RELEASE_TABLET | Freq: Every day | ORAL | Status: DC

## 2013-03-08 NOTE — Telephone Encounter (Signed)
Ronald Valentine was advised of what Dr. Karilyn Cota said and she would like a letter sent out when his next apt is due. Voices understood.

## 2013-03-08 NOTE — Telephone Encounter (Signed)
Pat called to let Dr. Karilyn Cota know Fayrene Fearing had some blood clots go to his lungs. He has been release from the hospital and they are trying to get his blood count leveled out. Having to go back daily at this time. They are going to put off the EGD for now. Dennie Bible would like to know if Audley needs to continue taking Pantoprazole 40 mg and if so please call in a new Rx for a 3 month supply #90 to his mail order company. [facility] fax number is [phone]. There return phone number is [phone].

## 2013-03-08 NOTE — Telephone Encounter (Signed)
Forwarded to Dr.Rehman. 

## 2013-03-08 NOTE — Telephone Encounter (Signed)
Patient will need an office appointment

## 2013-03-08 NOTE — Telephone Encounter (Signed)
Last seen for EGD and make 2013 2 prescription issued for pantoprazole for 90 days with 3 refills. He'll need office visit prior to next refill which would be in one year

## 2013-03-11 DIAGNOSIS — I2699 Other pulmonary embolism without acute cor pulmonale: Secondary | ICD-10-CM | POA: Diagnosis not present

## 2013-03-11 DIAGNOSIS — I80299 Phlebitis and thrombophlebitis of other deep vessels of unspecified lower extremity: Secondary | ICD-10-CM | POA: Diagnosis not present

## 2013-03-12 ENCOUNTER — Encounter: Payer: Self-pay | Admitting: Internal Medicine

## 2013-03-12 ENCOUNTER — Other Ambulatory Visit (INDEPENDENT_AMBULATORY_CARE_PROVIDER_SITE_OTHER): Payer: Medicare Other

## 2013-03-12 ENCOUNTER — Ambulatory Visit (INDEPENDENT_AMBULATORY_CARE_PROVIDER_SITE_OTHER)
Admission: RE | Admit: 2013-03-12 | Discharge: 2013-03-12 | Disposition: A | Discharge status: Home / Self Care | Payer: Medicare Other | Source: Ambulatory Visit | Attending: Internal Medicine | Admitting: Internal Medicine

## 2013-03-12 ENCOUNTER — Ambulatory Visit (INDEPENDENT_AMBULATORY_CARE_PROVIDER_SITE_OTHER): Payer: Medicare Other | Admitting: Internal Medicine

## 2013-03-12 VITALS — BP 114/72 | HR 83 | Temp 97.1°F | Ht 74.0 in | Wt 206.0 lb

## 2013-03-12 DIAGNOSIS — R0602 Shortness of breath: Secondary | ICD-10-CM | POA: Diagnosis not present

## 2013-03-12 DIAGNOSIS — R000 Tachycardia, unspecified: Secondary | ICD-10-CM | POA: Insufficient documentation

## 2013-03-12 DIAGNOSIS — I2699 Other pulmonary embolism without acute cor pulmonale: Secondary | ICD-10-CM | POA: Diagnosis not present

## 2013-03-12 DIAGNOSIS — J9600 Acute respiratory failure, unspecified whether with hypoxia or hypercapnia: Secondary | ICD-10-CM | POA: Insufficient documentation

## 2013-03-12 LAB — CBC WITH DIFFERENTIAL/PLATELET
Basophils Relative: 0.4 % (ref 0.0–3.0)
Eosinophils Relative: 0.4 % (ref 0.0–5.0)
Lymphocytes Relative: 14.9 % (ref 12.0–46.0)
MCV: 87.4 fl (ref 78.0–100.0)
Monocytes Absolute: 1 10*3/uL (ref 0.1–1.0)
Monocytes Relative: 10.5 % (ref 3.0–12.0)
Neutrophils Relative %: 73.8 % (ref 43.0–77.0)
Platelets: 319 10*3/uL (ref 150.0–400.0)
RBC: 4.97 Mil/uL (ref 4.22–5.81)
WBC: 9.5 10*3/uL (ref 4.5–10.5)

## 2013-03-12 LAB — BASIC METABOLIC PANEL
Chloride: 102 mEq/L (ref 96–112)
GFR: 60.61 mL/min (ref >60.00)
Glucose, Bld: 125 mg/dL — ABNORMAL HIGH (ref 70–99)
Potassium: 4 mEq/L (ref 3.5–5.1)
Sodium: 136 mEq/L (ref 135–145)

## 2013-03-12 LAB — BRAIN NATRIURETIC PEPTIDE: Pro B Natriuretic peptide (BNP): 20 pg/mL (ref 0.0–100.0)

## 2013-03-12 NOTE — Patient Instructions (Addendum)
Please remember to go to the lab and x-ray department downstairs for your tests - we will call you with the results when they are available.     I will give Dr Reuel Boom additional recommendations after I review your entire record and new labs and xrays

## 2013-03-12 NOTE — Progress Notes (Signed)
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 Neck pain gone.  Some leg swelling on L > R getting better  Cough on acei before trip no MB assoc with sensation of drainage despite PPI daily > resolved prior to OV  .   No obvious daytime variabilty or assoc  cp or chest tightness, subjective wheeze overt sinus or hb symptoms. No unusual exp hx or h/o childhood pna/ asthma or knowledge of premature birth.   Sleeping ok in terms of resp complaints  without nocturnal  or early am exacerbation  of respiratory  c/o's or need for noct saba. Also denies any obvious fluctuation of symptoms with weather or environmental changes or other aggravating or alleviating factors except as outlined above  Current Medications, Allergies, Past Medical History, Past Surgical History, Family History, and Social History were reviewed in [employer] record.  ROS  The following are not active complaints unless bolded sore throat, dysphagia, dental problems, itching, sneezing,  nasal congestion or excess/ purulent secretions, ear ache,   fever, chills, sweats, unintended wt loss, pleuritic or exertional cp, hemoptysis,  orthopnea pnd or leg swelling, presyncope, palpitations, heartburn, abdominal pain, anorexia, nausea, vomiting, diarrhea  or change in bowel or  urinary habits, change in stools or urine, dysuria,hematuria,  rash, arthralgias, visual complaints, headache, numbness weakness or ataxia or problems with walking or coordination,  change in mood/affect = much more anxious vs baseline or memory.      Review of Systems     Objective:   Physical Exam Wt Readings from Last 3 Encounters:  03/12/13 206 lb (93.441 kg)  12/26/11 218 lb (98.884 kg)  12/26/11 218 lb (98.884 kg)     HEENT: nl dentition, turbinates, and orophanx. Nl external ear canals without cough reflex   NECK :  without JVD/Nodes/TM/ nl carotid upstrokes bilaterally   LUNGS: no acc muscle use, clear to A and P bilaterally without cough on insp or exp maneuvers   CV:  RRR  no s3 or murmur or increase in P2,  L > R mild pitting Lower ext edema   ABD:  soft and nontender with nl excursion in the supine position. No bruits or organomegaly, bowel sounds nl  MS:  warm without deformities, calf tenderness, cyanosis or clubbing  SKIN: warm and dry without lesions    NEURO:  alert, approp, no deficits    CXR  03/12/2013 :  No acute cardiopulmonary abnormality seen.    03/12/2013 labs ok x Ca 11.0    Assessment:

## 2013-03-14 NOTE — Assessment & Plan Note (Signed)
11.0 s alb level  Probably related to use of HCTZ but relatively mild and would consider change to furosemide at next refill   I would also add that interpretation of pulmonary symptoms is sometimes rendered more difficult for pts on acei so would have a low threshold to change to ARB here but defer the final decision to Dr Garner Nash.

## 2013-03-14 NOTE — Assessment & Plan Note (Signed)
-   onset 02/12/13 with PE > already self titrating with 02 sats > 90 a reasonable goal though would probably need an overnight study on RA before stopping that component of therapy permanently

## 2013-03-14 NOTE — Assessment & Plan Note (Signed)
-   Dx 02/12/13 [facility]. 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

## 2013-03-15 DIAGNOSIS — I2699 Other pulmonary embolism without acute cor pulmonale: Secondary | ICD-10-CM | POA: Diagnosis not present

## 2013-03-15 DIAGNOSIS — I80299 Phlebitis and thrombophlebitis of other deep vessels of unspecified lower extremity: Secondary | ICD-10-CM | POA: Diagnosis not present

## 2013-03-16 DIAGNOSIS — M1990 Unspecified osteoarthritis, unspecified site: Secondary | ICD-10-CM | POA: Diagnosis not present

## 2013-03-16 DIAGNOSIS — N10 Acute tubulo-interstitial nephritis: Secondary | ICD-10-CM | POA: Diagnosis not present

## 2013-03-16 DIAGNOSIS — E782 Mixed hyperlipidemia: Secondary | ICD-10-CM | POA: Diagnosis not present

## 2013-03-16 DIAGNOSIS — I10 Essential (primary) hypertension: Secondary | ICD-10-CM | POA: Diagnosis not present

## 2013-03-16 DIAGNOSIS — R110 Nausea: Secondary | ICD-10-CM | POA: Diagnosis not present

## 2013-03-16 DIAGNOSIS — I2699 Other pulmonary embolism without acute cor pulmonale: Secondary | ICD-10-CM | POA: Diagnosis not present

## 2013-03-17 DIAGNOSIS — I2699 Other pulmonary embolism without acute cor pulmonale: Secondary | ICD-10-CM | POA: Diagnosis not present

## 2013-03-21 DIAGNOSIS — I2699 Other pulmonary embolism without acute cor pulmonale: Secondary | ICD-10-CM | POA: Diagnosis not present

## 2013-03-22 DIAGNOSIS — I80299 Phlebitis and thrombophlebitis of other deep vessels of unspecified lower extremity: Secondary | ICD-10-CM | POA: Diagnosis not present

## 2013-03-23 ENCOUNTER — Ambulatory Visit (INDEPENDENT_AMBULATORY_CARE_PROVIDER_SITE_OTHER): Payer: Medicare Other | Admitting: Internal Medicine

## 2013-03-23 DIAGNOSIS — R110 Nausea: Secondary | ICD-10-CM | POA: Diagnosis not present

## 2013-03-23 DIAGNOSIS — E782 Mixed hyperlipidemia: Secondary | ICD-10-CM | POA: Diagnosis not present

## 2013-03-23 DIAGNOSIS — I10 Essential (primary) hypertension: Secondary | ICD-10-CM | POA: Diagnosis not present

## 2013-03-23 DIAGNOSIS — I2699 Other pulmonary embolism without acute cor pulmonale: Secondary | ICD-10-CM | POA: Diagnosis not present

## 2013-03-23 DIAGNOSIS — N10 Acute tubulo-interstitial nephritis: Secondary | ICD-10-CM | POA: Diagnosis not present

## 2013-03-23 DIAGNOSIS — M1990 Unspecified osteoarthritis, unspecified site: Secondary | ICD-10-CM | POA: Diagnosis not present

## 2013-03-26 DIAGNOSIS — I2699 Other pulmonary embolism without acute cor pulmonale: Secondary | ICD-10-CM | POA: Diagnosis not present

## 2013-03-26 DIAGNOSIS — N529 Male erectile dysfunction, unspecified: Secondary | ICD-10-CM | POA: Diagnosis not present

## 2013-03-30 DIAGNOSIS — M1990 Unspecified osteoarthritis, unspecified site: Secondary | ICD-10-CM | POA: Diagnosis not present

## 2013-03-30 DIAGNOSIS — E782 Mixed hyperlipidemia: Secondary | ICD-10-CM | POA: Diagnosis not present

## 2013-03-30 DIAGNOSIS — N3090 Cystitis, unspecified without hematuria: Secondary | ICD-10-CM | POA: Diagnosis not present

## 2013-03-30 DIAGNOSIS — I10 Essential (primary) hypertension: Secondary | ICD-10-CM | POA: Diagnosis not present

## 2013-03-30 DIAGNOSIS — I2699 Other pulmonary embolism without acute cor pulmonale: Secondary | ICD-10-CM | POA: Diagnosis not present

## 2013-04-03 DIAGNOSIS — Z7901 Long term (current) use of anticoagulants: Secondary | ICD-10-CM | POA: Diagnosis not present

## 2013-04-03 DIAGNOSIS — K219 Gastro-esophageal reflux disease without esophagitis: Secondary | ICD-10-CM | POA: Diagnosis not present

## 2013-04-03 DIAGNOSIS — I10 Essential (primary) hypertension: Secondary | ICD-10-CM | POA: Diagnosis not present

## 2013-04-03 DIAGNOSIS — K6289 Other specified diseases of anus and rectum: Secondary | ICD-10-CM | POA: Diagnosis not present

## 2013-04-03 DIAGNOSIS — K625 Hemorrhage of anus and rectum: Secondary | ICD-10-CM | POA: Diagnosis not present

## 2013-04-03 DIAGNOSIS — Z79899 Other long term (current) drug therapy: Secondary | ICD-10-CM | POA: Diagnosis not present

## 2013-04-03 DIAGNOSIS — Z87442 Personal history of urinary calculi: Secondary | ICD-10-CM | POA: Diagnosis not present

## 2013-04-05 ENCOUNTER — Institutional Professional Consult (permissible substitution): Payer: Medicare Other | Admitting: Pulmonary Disease

## 2013-04-05 DIAGNOSIS — I2699 Other pulmonary embolism without acute cor pulmonale: Secondary | ICD-10-CM | POA: Diagnosis not present

## 2013-04-05 DIAGNOSIS — K625 Hemorrhage of anus and rectum: Secondary | ICD-10-CM | POA: Diagnosis not present

## 2013-04-06 DIAGNOSIS — I80299 Phlebitis and thrombophlebitis of other deep vessels of unspecified lower extremity: Secondary | ICD-10-CM | POA: Diagnosis not present

## 2013-04-09 DIAGNOSIS — I80299 Phlebitis and thrombophlebitis of other deep vessels of unspecified lower extremity: Secondary | ICD-10-CM | POA: Diagnosis not present

## 2013-04-09 DIAGNOSIS — D649 Anemia, unspecified: Secondary | ICD-10-CM | POA: Diagnosis not present

## 2013-04-09 DIAGNOSIS — Z23 Encounter for immunization: Secondary | ICD-10-CM | POA: Diagnosis not present

## 2013-04-09 DIAGNOSIS — I2699 Other pulmonary embolism without acute cor pulmonale: Secondary | ICD-10-CM | POA: Diagnosis not present

## 2013-04-12 DIAGNOSIS — I80299 Phlebitis and thrombophlebitis of other deep vessels of unspecified lower extremity: Secondary | ICD-10-CM | POA: Diagnosis not present

## 2013-04-12 DIAGNOSIS — I2699 Other pulmonary embolism without acute cor pulmonale: Secondary | ICD-10-CM | POA: Diagnosis not present

## 2013-04-14 DIAGNOSIS — I2699 Other pulmonary embolism without acute cor pulmonale: Secondary | ICD-10-CM | POA: Diagnosis not present

## 2013-04-16 DIAGNOSIS — I80299 Phlebitis and thrombophlebitis of other deep vessels of unspecified lower extremity: Secondary | ICD-10-CM | POA: Diagnosis not present

## 2013-04-22 DIAGNOSIS — I2699 Other pulmonary embolism without acute cor pulmonale: Secondary | ICD-10-CM | POA: Diagnosis not present

## 2013-04-23 DIAGNOSIS — R040 Epistaxis: Secondary | ICD-10-CM | POA: Diagnosis not present

## 2013-04-23 DIAGNOSIS — I2699 Other pulmonary embolism without acute cor pulmonale: Secondary | ICD-10-CM | POA: Diagnosis not present

## 2013-04-26 DIAGNOSIS — I80299 Phlebitis and thrombophlebitis of other deep vessels of unspecified lower extremity: Secondary | ICD-10-CM | POA: Diagnosis not present

## 2013-04-30 DIAGNOSIS — I80299 Phlebitis and thrombophlebitis of other deep vessels of unspecified lower extremity: Secondary | ICD-10-CM | POA: Diagnosis not present

## 2013-05-07 DIAGNOSIS — I80299 Phlebitis and thrombophlebitis of other deep vessels of unspecified lower extremity: Secondary | ICD-10-CM | POA: Diagnosis not present

## 2013-05-12 DIAGNOSIS — I10 Essential (primary) hypertension: Secondary | ICD-10-CM | POA: Diagnosis not present

## 2013-05-12 DIAGNOSIS — E782 Mixed hyperlipidemia: Secondary | ICD-10-CM | POA: Diagnosis not present

## 2013-05-18 DIAGNOSIS — I2699 Other pulmonary embolism without acute cor pulmonale: Secondary | ICD-10-CM | POA: Diagnosis not present

## 2013-05-18 DIAGNOSIS — E782 Mixed hyperlipidemia: Secondary | ICD-10-CM | POA: Diagnosis not present

## 2013-05-18 DIAGNOSIS — I10 Essential (primary) hypertension: Secondary | ICD-10-CM | POA: Diagnosis not present

## 2013-05-18 DIAGNOSIS — M1990 Unspecified osteoarthritis, unspecified site: Secondary | ICD-10-CM | POA: Diagnosis not present

## 2013-06-04 DIAGNOSIS — I80299 Phlebitis and thrombophlebitis of other deep vessels of unspecified lower extremity: Secondary | ICD-10-CM | POA: Diagnosis not present

## 2013-06-25 DIAGNOSIS — R079 Chest pain, unspecified: Secondary | ICD-10-CM | POA: Diagnosis not present

## 2013-06-25 DIAGNOSIS — I80299 Phlebitis and thrombophlebitis of other deep vessels of unspecified lower extremity: Secondary | ICD-10-CM | POA: Diagnosis not present

## 2013-06-25 DIAGNOSIS — R0609 Other forms of dyspnea: Secondary | ICD-10-CM | POA: Diagnosis not present

## 2013-06-25 DIAGNOSIS — R05 Cough: Secondary | ICD-10-CM | POA: Diagnosis not present

## 2013-07-28 DIAGNOSIS — I80299 Phlebitis and thrombophlebitis of other deep vessels of unspecified lower extremity: Secondary | ICD-10-CM | POA: Diagnosis not present

## 2013-08-19 DIAGNOSIS — R0989 Other specified symptoms and signs involving the circulatory and respiratory systems: Secondary | ICD-10-CM | POA: Diagnosis not present

## 2013-08-19 DIAGNOSIS — I10 Essential (primary) hypertension: Secondary | ICD-10-CM | POA: Diagnosis not present

## 2013-08-19 DIAGNOSIS — R0609 Other forms of dyspnea: Secondary | ICD-10-CM | POA: Diagnosis not present

## 2013-08-25 DIAGNOSIS — I80299 Phlebitis and thrombophlebitis of other deep vessels of unspecified lower extremity: Secondary | ICD-10-CM | POA: Diagnosis not present

## 2013-08-28 IMAGING — CR DG CHEST 2V
2 series · 2 of 2 positions shown · non-contrast
Comparison: November 10, 2009.

CLINICAL DATA: Shortness of breath

CHEST - 2 VIEW

[view not recorded (1 of 2)]
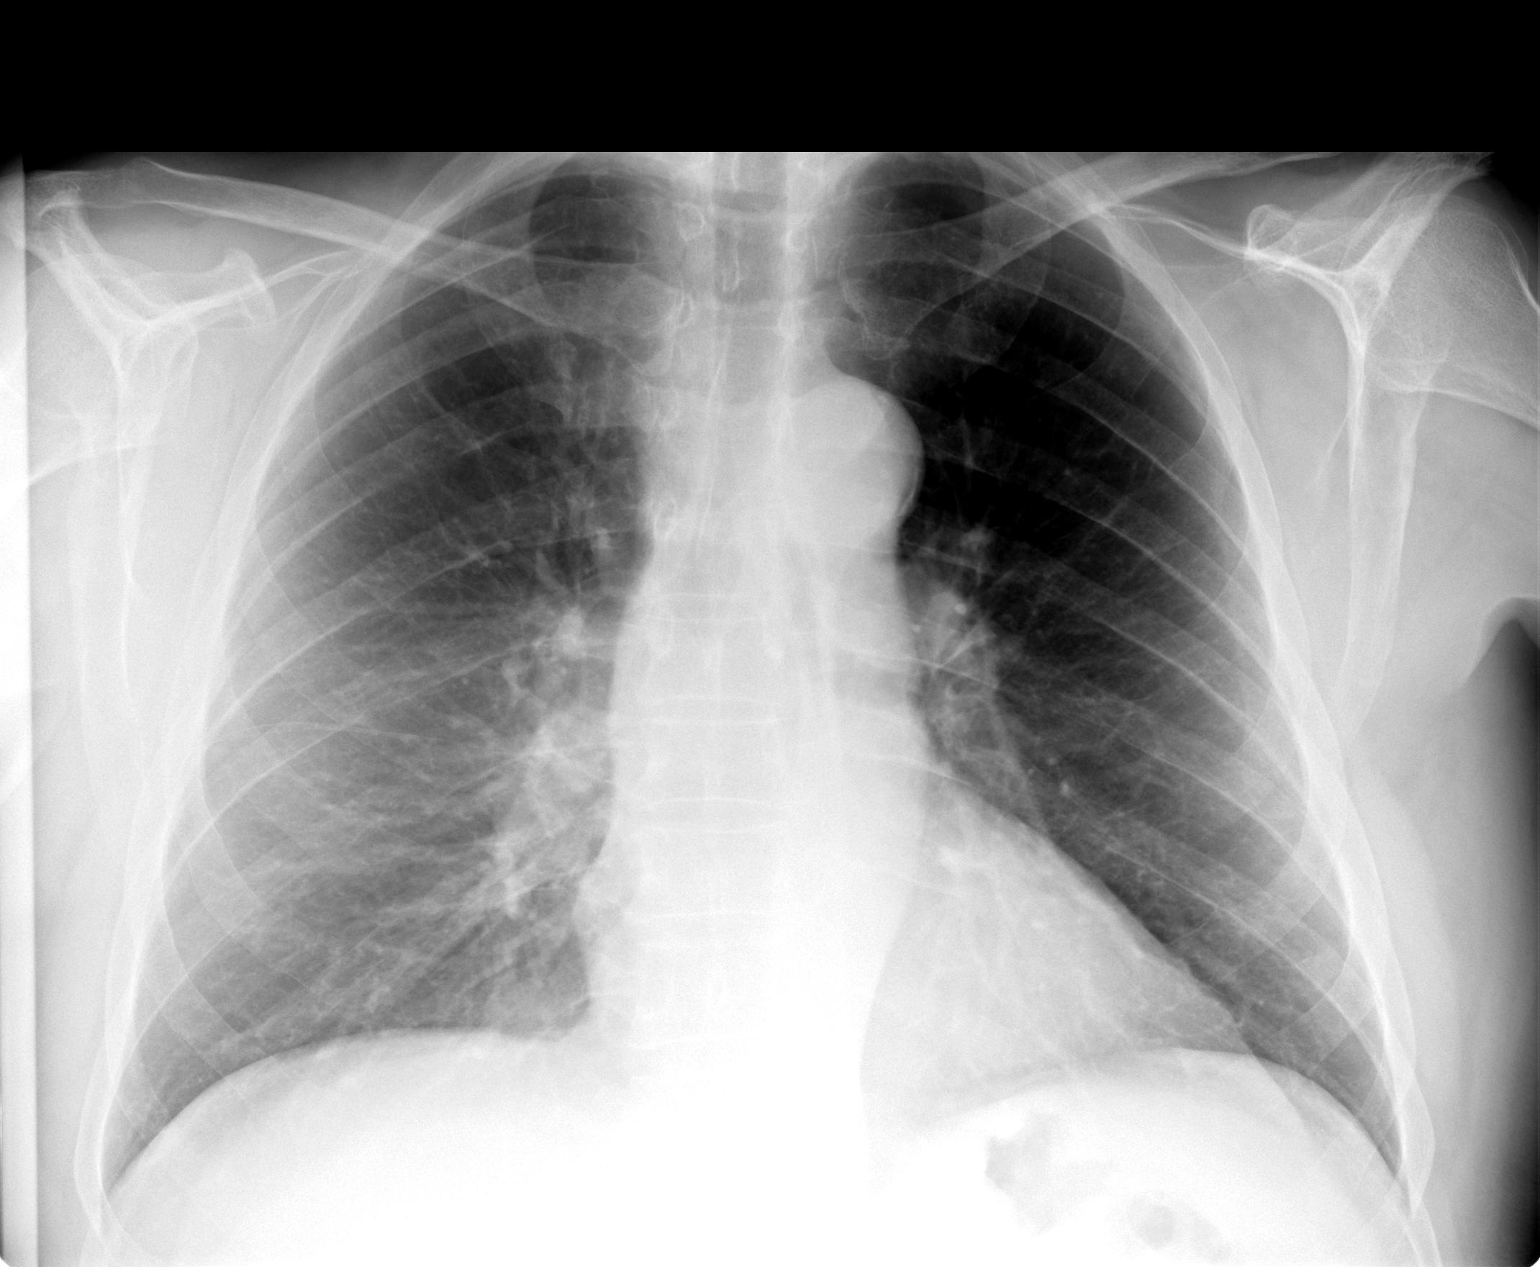

[view not recorded (2 of 2)]
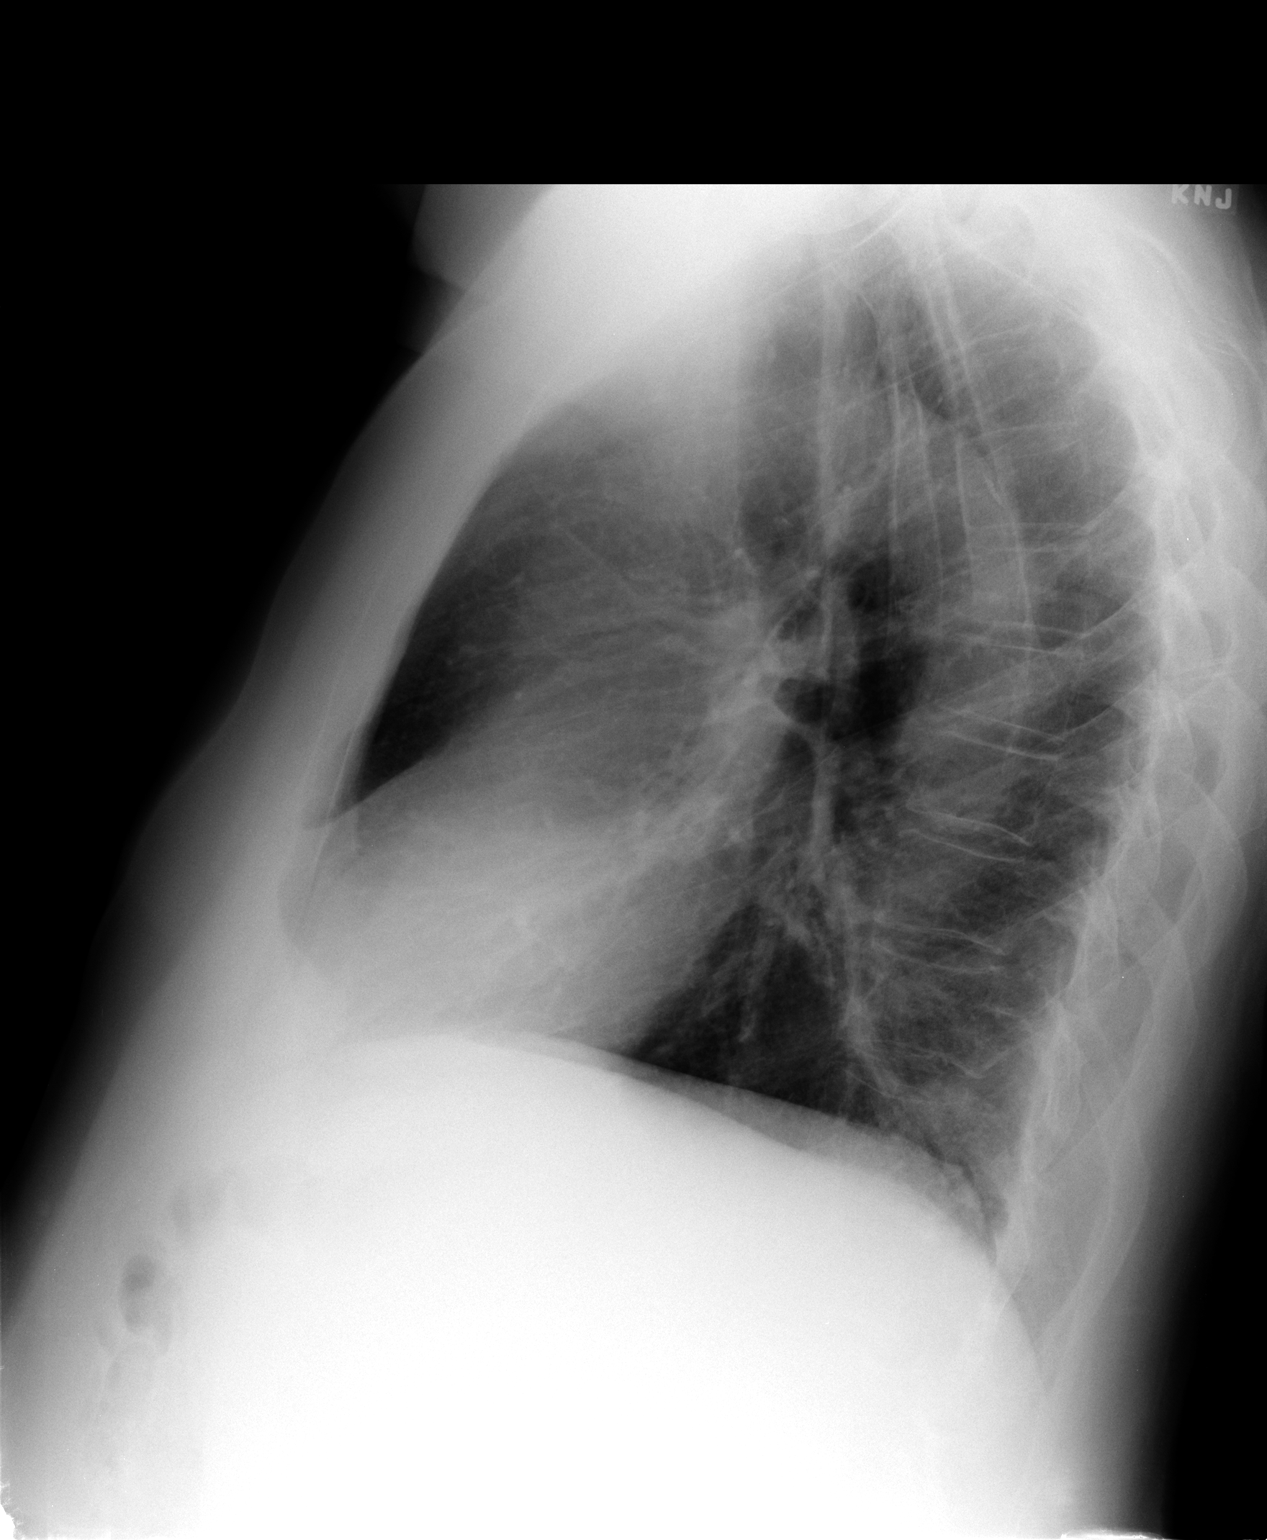

[2 of 2 positions shown; findings below may reference images not displayed]

FINDINGS: Cardiomediastinal silhouette appears normal.  No acute
pulmonary disease is noted.  Bony thorax is intact.
IMPRESSION: No acute cardiopulmonary abnormality seen.

## 2013-09-01 DIAGNOSIS — E782 Mixed hyperlipidemia: Secondary | ICD-10-CM | POA: Diagnosis not present

## 2013-09-01 DIAGNOSIS — K2100 Gastro-esophageal reflux disease with esophagitis, without bleeding: Secondary | ICD-10-CM | POA: Diagnosis not present

## 2013-09-01 DIAGNOSIS — N183 Chronic kidney disease, stage 3 unspecified: Secondary | ICD-10-CM | POA: Diagnosis not present

## 2013-09-01 DIAGNOSIS — I2699 Other pulmonary embolism without acute cor pulmonale: Secondary | ICD-10-CM | POA: Diagnosis not present

## 2013-09-01 DIAGNOSIS — M1990 Unspecified osteoarthritis, unspecified site: Secondary | ICD-10-CM | POA: Diagnosis not present

## 2013-09-01 DIAGNOSIS — I10 Essential (primary) hypertension: Secondary | ICD-10-CM | POA: Diagnosis not present

## 2013-09-08 DIAGNOSIS — I80299 Phlebitis and thrombophlebitis of other deep vessels of unspecified lower extremity: Secondary | ICD-10-CM | POA: Diagnosis not present

## 2013-09-10 DIAGNOSIS — I2699 Other pulmonary embolism without acute cor pulmonale: Secondary | ICD-10-CM | POA: Diagnosis not present

## 2013-09-13 DIAGNOSIS — N529 Male erectile dysfunction, unspecified: Secondary | ICD-10-CM | POA: Diagnosis not present

## 2013-09-13 DIAGNOSIS — Z79899 Other long term (current) drug therapy: Secondary | ICD-10-CM | POA: Diagnosis not present

## 2013-09-13 DIAGNOSIS — I10 Essential (primary) hypertension: Secondary | ICD-10-CM | POA: Diagnosis not present

## 2013-09-13 DIAGNOSIS — K2100 Gastro-esophageal reflux disease with esophagitis, without bleeding: Secondary | ICD-10-CM | POA: Diagnosis not present

## 2013-09-13 DIAGNOSIS — Z0000 Encounter for general adult medical examination without abnormal findings: Secondary | ICD-10-CM | POA: Diagnosis not present

## 2013-09-13 DIAGNOSIS — E782 Mixed hyperlipidemia: Secondary | ICD-10-CM | POA: Diagnosis not present

## 2013-09-13 DIAGNOSIS — D649 Anemia, unspecified: Secondary | ICD-10-CM | POA: Diagnosis not present

## 2013-09-20 DIAGNOSIS — I10 Essential (primary) hypertension: Secondary | ICD-10-CM | POA: Diagnosis not present

## 2013-09-20 DIAGNOSIS — K2100 Gastro-esophageal reflux disease with esophagitis, without bleeding: Secondary | ICD-10-CM | POA: Diagnosis not present

## 2013-09-20 DIAGNOSIS — Z0000 Encounter for general adult medical examination without abnormal findings: Secondary | ICD-10-CM | POA: Diagnosis not present

## 2013-09-20 DIAGNOSIS — I2699 Other pulmonary embolism without acute cor pulmonale: Secondary | ICD-10-CM | POA: Diagnosis not present

## 2013-09-20 DIAGNOSIS — D239 Other benign neoplasm of skin, unspecified: Secondary | ICD-10-CM | POA: Diagnosis not present

## 2013-09-20 DIAGNOSIS — M1990 Unspecified osteoarthritis, unspecified site: Secondary | ICD-10-CM | POA: Diagnosis not present

## 2013-09-20 DIAGNOSIS — L821 Other seborrheic keratosis: Secondary | ICD-10-CM | POA: Diagnosis not present

## 2013-09-20 DIAGNOSIS — N183 Chronic kidney disease, stage 3 unspecified: Secondary | ICD-10-CM | POA: Diagnosis not present

## 2013-09-20 DIAGNOSIS — E782 Mixed hyperlipidemia: Secondary | ICD-10-CM | POA: Diagnosis not present

## 2013-09-24 DIAGNOSIS — I2699 Other pulmonary embolism without acute cor pulmonale: Secondary | ICD-10-CM | POA: Diagnosis not present

## 2013-09-28 ENCOUNTER — Telehealth (INDEPENDENT_AMBULATORY_CARE_PROVIDER_SITE_OTHER): Payer: Self-pay | Admitting: *Deleted

## 2013-09-28 DIAGNOSIS — I80299 Phlebitis and thrombophlebitis of other deep vessels of unspecified lower extremity: Secondary | ICD-10-CM | POA: Diagnosis not present

## 2013-09-28 NOTE — Telephone Encounter (Signed)
Noted , there is nothing we can do until we have that medication. Patient's responsibility.

## 2013-09-28 NOTE — Telephone Encounter (Signed)
Cannon said her Pantoprazole has went from $15 to $50. This is with [facility]. He would like to know if there is a another medication he can get, that may be cheaper. Conlee Sliter to call his insurance company to see what medications is in the same family that may be cheaper. Voice understood and he will call back after he speaks with them.

## 2013-10-05 DIAGNOSIS — I80299 Phlebitis and thrombophlebitis of other deep vessels of unspecified lower extremity: Secondary | ICD-10-CM | POA: Diagnosis not present

## 2013-10-05 DIAGNOSIS — I2699 Other pulmonary embolism without acute cor pulmonale: Secondary | ICD-10-CM | POA: Diagnosis not present

## 2013-10-22 DIAGNOSIS — I80299 Phlebitis and thrombophlebitis of other deep vessels of unspecified lower extremity: Secondary | ICD-10-CM | POA: Diagnosis not present

## 2013-10-22 DIAGNOSIS — I2699 Other pulmonary embolism without acute cor pulmonale: Secondary | ICD-10-CM | POA: Diagnosis not present

## 2013-11-05 DIAGNOSIS — I2699 Other pulmonary embolism without acute cor pulmonale: Secondary | ICD-10-CM | POA: Diagnosis not present

## 2013-12-10 DIAGNOSIS — R609 Edema, unspecified: Secondary | ICD-10-CM | POA: Diagnosis not present

## 2013-12-10 DIAGNOSIS — I2699 Other pulmonary embolism without acute cor pulmonale: Secondary | ICD-10-CM | POA: Diagnosis not present

## 2013-12-22 ENCOUNTER — Encounter (INDEPENDENT_AMBULATORY_CARE_PROVIDER_SITE_OTHER): Payer: Self-pay | Admitting: *Deleted

## 2014-01-03 DIAGNOSIS — E782 Mixed hyperlipidemia: Secondary | ICD-10-CM | POA: Diagnosis not present

## 2014-01-03 DIAGNOSIS — I10 Essential (primary) hypertension: Secondary | ICD-10-CM | POA: Diagnosis not present

## 2014-01-10 DIAGNOSIS — M1990 Unspecified osteoarthritis, unspecified site: Secondary | ICD-10-CM | POA: Diagnosis not present

## 2014-01-10 DIAGNOSIS — N183 Chronic kidney disease, stage 3 unspecified: Secondary | ICD-10-CM | POA: Diagnosis not present

## 2014-01-10 DIAGNOSIS — I2699 Other pulmonary embolism without acute cor pulmonale: Secondary | ICD-10-CM | POA: Diagnosis not present

## 2014-01-10 DIAGNOSIS — I10 Essential (primary) hypertension: Secondary | ICD-10-CM | POA: Diagnosis not present

## 2014-01-10 DIAGNOSIS — K2100 Gastro-esophageal reflux disease with esophagitis, without bleeding: Secondary | ICD-10-CM | POA: Diagnosis not present

## 2014-01-10 DIAGNOSIS — E8881 Metabolic syndrome: Secondary | ICD-10-CM | POA: Diagnosis not present

## 2014-01-10 DIAGNOSIS — E782 Mixed hyperlipidemia: Secondary | ICD-10-CM | POA: Diagnosis not present

## 2014-01-11 ENCOUNTER — Ambulatory Visit (INDEPENDENT_AMBULATORY_CARE_PROVIDER_SITE_OTHER): Payer: Medicare Other | Admitting: Internal Medicine

## 2014-01-11 ENCOUNTER — Other Ambulatory Visit (INDEPENDENT_AMBULATORY_CARE_PROVIDER_SITE_OTHER): Payer: Self-pay | Admitting: *Deleted

## 2014-01-11 ENCOUNTER — Encounter (INDEPENDENT_AMBULATORY_CARE_PROVIDER_SITE_OTHER): Payer: Self-pay | Admitting: *Deleted

## 2014-01-11 ENCOUNTER — Encounter (INDEPENDENT_AMBULATORY_CARE_PROVIDER_SITE_OTHER): Payer: Self-pay | Admitting: Internal Medicine

## 2014-01-11 VITALS — BP 138/82 | HR 72 | Temp 97.5°F | Ht 74.0 in | Wt 221.1 lb

## 2014-01-11 DIAGNOSIS — D1330 Benign neoplasm of unspecified part of small intestine: Secondary | ICD-10-CM

## 2014-01-11 DIAGNOSIS — D132 Benign neoplasm of duodenum: Secondary | ICD-10-CM

## 2014-01-11 NOTE — Patient Instructions (Signed)
Surveillance EGD 

## 2014-01-11 NOTE — Progress Notes (Addendum)
Subjective:     Patient ID: Ronald Valentine, male   DOB: 04-Jun-1941, 73 y.o.   MRN: [id number]  HPI Here today for f/u. He was last seen in 2013 after he underwent an EGD. His appetite is good. No weight loss.  Usually has a BM daily. No melena or bright red rectal bleeding. He tells me he was suppose to have an EGD in 2014.    Hx duodenal adenoma. of but he apparently developed a DVT left leg and he end up with a bilateral PE in 2014. He tells me he now has a left IVC filter. No acid reflux. No dysphagia.      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 His last exam was over two years ago at [facility].  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 Stricture dilated by passing 54 French Maloney dilator. Please note patient did not give history of dysphagia but I was concerned that he would start having dysphagia soon.  Erosive gastritis possibly secondary to NSAID.  4 mm island of abnormal appearing duodenal mucosa next the scar. Endoscopically it did not appear to be an adenoma. It was biopsied for histology.   Biopsy:  Notes Recorded by Rogene Houston, MD on 12/31/2011 at 9:10 AM Duodenal polyp is a tubular adenoma. Results reviewed the patient's wife as he is not home. Repeat EGD in one year Report to Dr. Quillian Quince    10/17/2011: Colonoscopy with snare polypectomy Impression:  Examination performed to cecum.  Left-sided diverticulosis.  6 mm polyp snared from distal transverse colon.  External hemorrhoids     Review of Systems Married, two children, Retired from [employer]     Past Medical History  Diagnosis Date  . Hypertension   . GERD (gastroesophageal reflux disease)   . Arthritis   . Colon polyp   . Kidney stones   . Pulmonary embolism, bilateral   . Left leg DVT     Past Surgical History  Procedure Laterality Date  . Colonoscopy    . Lithotripsy    . Colonoscopy  10/17/2011   Procedure: COLONOSCOPY;  Surgeon: Rogene Houston, MD;  [facility];  Service: Endoscopy;  Laterality: N/A;  930  . Esophagogastroduodenoscopy  12/26/2011    Procedure: ESOPHAGOGASTRODUODENOSCOPY (EGD);  Surgeon: Rogene Houston, MD;  [facility];  Service: Endoscopy;  Laterality: N/A;  730    No Known Allergies  Current Outpatient Prescriptions on File Prior to Visit  Medication Sig Dispense Refill  . lisinopril-hydrochlorothiazide (PRINZIDE,ZESTORETIC) 20-12.5 MG per tablet Take 1 tablet by mouth daily.      . temazepam (RESTORIL) 15 MG capsule Take 15 mg by mouth at bedtime as needed for sleep. 7.5mg  at night      . verapamil (CALAN-SR) 240 MG CR tablet Take 240 mg by mouth daily.      Marland Kitchen warfarin (COUMADIN) 5 MG tablet Take 7.5 mg by mouth daily. As directed per Dr Gar Ponto 7.5mg  every other day and then 5mg  every other day.       No current facility-administered medications on file prior to visit.     Objective:   Physical Exam  Filed Vitals:   01/11/14 1546  BP: 138/82  Pulse: 72  Temp: 97.5 F (36.4 C)  Height: 6\' 2"  (1.88 m)  Weight: 221 lb 1.6 oz (100.29 kg)   Alert and oriented. Skin warm and dry. Oral mucosa is  moist.   . Sclera anicteric, conjunctivae is pink. Thyroid not enlarged. No cervical lymphadenopathy. Lungs clear. Heart regular rate and rhythm.  Abdomen is soft. Bowel sounds are positive. No hepatomegaly. No abdominal masses felt. No tenderness.  No edema to lower extremities.       Assessment:    GERD controlled at this time. Hx of duodenal adenoma which needs surveillance    Plan:    EGD for surveillance for hx duodenal adenoma.

## 2014-01-12 ENCOUNTER — Encounter (HOSPITAL_COMMUNITY): Payer: Self-pay | Admitting: Pharmacy Technician

## 2014-01-12 ENCOUNTER — Telehealth (INDEPENDENT_AMBULATORY_CARE_PROVIDER_SITE_OTHER): Payer: Self-pay | Admitting: *Deleted

## 2014-01-12 NOTE — Telephone Encounter (Signed)
I will let Dr. Laural Golden know also.  Dr. Laural Golden see note.

## 2014-01-12 NOTE — Telephone Encounter (Signed)
Patient called, wants to cancel EGD for now and he will get back with me to reschedule

## 2014-01-13 NOTE — Telephone Encounter (Signed)
Patient will call to schedule an EGD when he is ready

## 2014-01-21 ENCOUNTER — Ambulatory Visit: Admit: 2014-01-21 | Payer: Self-pay | Admitting: Internal Medicine

## 2014-01-21 SURGERY — EGD (ESOPHAGOGASTRODUODENOSCOPY)
Anesthesia: Moderate Sedation

## 2014-02-14 DIAGNOSIS — I2699 Other pulmonary embolism without acute cor pulmonale: Secondary | ICD-10-CM | POA: Diagnosis not present

## 2014-02-14 DIAGNOSIS — I80299 Phlebitis and thrombophlebitis of other deep vessels of unspecified lower extremity: Secondary | ICD-10-CM | POA: Diagnosis not present

## 2014-03-14 DIAGNOSIS — I80299 Phlebitis and thrombophlebitis of other deep vessels of unspecified lower extremity: Secondary | ICD-10-CM | POA: Diagnosis not present

## 2014-04-05 DIAGNOSIS — I80299 Phlebitis and thrombophlebitis of other deep vessels of unspecified lower extremity: Secondary | ICD-10-CM | POA: Diagnosis not present

## 2014-05-09 DIAGNOSIS — I2699 Other pulmonary embolism without acute cor pulmonale: Secondary | ICD-10-CM | POA: Diagnosis not present

## 2014-05-09 DIAGNOSIS — N183 Chronic kidney disease, stage 3 (moderate): Secondary | ICD-10-CM | POA: Diagnosis not present

## 2014-05-09 DIAGNOSIS — Z23 Encounter for immunization: Secondary | ICD-10-CM | POA: Diagnosis not present

## 2014-05-09 DIAGNOSIS — I10 Essential (primary) hypertension: Secondary | ICD-10-CM | POA: Diagnosis not present

## 2014-05-09 DIAGNOSIS — E782 Mixed hyperlipidemia: Secondary | ICD-10-CM | POA: Diagnosis not present

## 2014-05-09 DIAGNOSIS — K210 Gastro-esophageal reflux disease with esophagitis: Secondary | ICD-10-CM | POA: Diagnosis not present

## 2014-05-16 DIAGNOSIS — E782 Mixed hyperlipidemia: Secondary | ICD-10-CM | POA: Diagnosis not present

## 2014-05-16 DIAGNOSIS — E8881 Metabolic syndrome: Secondary | ICD-10-CM | POA: Diagnosis not present

## 2014-05-16 DIAGNOSIS — F5221 Male erectile disorder: Secondary | ICD-10-CM | POA: Diagnosis not present

## 2014-05-16 DIAGNOSIS — I2699 Other pulmonary embolism without acute cor pulmonale: Secondary | ICD-10-CM | POA: Diagnosis not present

## 2014-05-16 DIAGNOSIS — E119 Type 2 diabetes mellitus without complications: Secondary | ICD-10-CM | POA: Diagnosis not present

## 2014-05-16 DIAGNOSIS — K210 Gastro-esophageal reflux disease with esophagitis: Secondary | ICD-10-CM | POA: Diagnosis not present

## 2014-05-16 DIAGNOSIS — N183 Chronic kidney disease, stage 3 (moderate): Secondary | ICD-10-CM | POA: Diagnosis not present

## 2014-05-16 DIAGNOSIS — I10 Essential (primary) hypertension: Secondary | ICD-10-CM | POA: Diagnosis not present

## 2014-06-08 DIAGNOSIS — I2699 Other pulmonary embolism without acute cor pulmonale: Secondary | ICD-10-CM | POA: Diagnosis not present

## 2014-07-08 DIAGNOSIS — I2699 Other pulmonary embolism without acute cor pulmonale: Secondary | ICD-10-CM | POA: Diagnosis not present

## 2014-07-25 DIAGNOSIS — I2699 Other pulmonary embolism without acute cor pulmonale: Secondary | ICD-10-CM | POA: Diagnosis not present

## 2014-08-15 DIAGNOSIS — I2699 Other pulmonary embolism without acute cor pulmonale: Secondary | ICD-10-CM | POA: Diagnosis not present

## 2014-08-15 DIAGNOSIS — M1712 Unilateral primary osteoarthritis, left knee: Secondary | ICD-10-CM | POA: Diagnosis not present

## 2014-09-15 DIAGNOSIS — I2699 Other pulmonary embolism without acute cor pulmonale: Secondary | ICD-10-CM | POA: Diagnosis not present

## 2014-09-21 DIAGNOSIS — I10 Essential (primary) hypertension: Secondary | ICD-10-CM | POA: Diagnosis not present

## 2014-09-21 DIAGNOSIS — K210 Gastro-esophageal reflux disease with esophagitis: Secondary | ICD-10-CM | POA: Diagnosis not present

## 2014-09-21 DIAGNOSIS — E119 Type 2 diabetes mellitus without complications: Secondary | ICD-10-CM | POA: Diagnosis not present

## 2014-09-21 DIAGNOSIS — I2699 Other pulmonary embolism without acute cor pulmonale: Secondary | ICD-10-CM | POA: Diagnosis not present

## 2014-09-21 DIAGNOSIS — E782 Mixed hyperlipidemia: Secondary | ICD-10-CM | POA: Diagnosis not present

## 2014-09-21 DIAGNOSIS — N183 Chronic kidney disease, stage 3 (moderate): Secondary | ICD-10-CM | POA: Diagnosis not present

## 2014-09-26 DIAGNOSIS — L570 Actinic keratosis: Secondary | ICD-10-CM | POA: Diagnosis not present

## 2014-09-26 DIAGNOSIS — L821 Other seborrheic keratosis: Secondary | ICD-10-CM | POA: Diagnosis not present

## 2014-09-26 DIAGNOSIS — D239 Other benign neoplasm of skin, unspecified: Secondary | ICD-10-CM | POA: Diagnosis not present

## 2014-09-29 DIAGNOSIS — I10 Essential (primary) hypertension: Secondary | ICD-10-CM | POA: Diagnosis not present

## 2014-09-29 DIAGNOSIS — F5221 Male erectile disorder: Secondary | ICD-10-CM | POA: Diagnosis not present

## 2014-09-29 DIAGNOSIS — Z1389 Encounter for screening for other disorder: Secondary | ICD-10-CM | POA: Diagnosis not present

## 2014-09-29 DIAGNOSIS — N183 Chronic kidney disease, stage 3 (moderate): Secondary | ICD-10-CM | POA: Diagnosis not present

## 2014-09-29 DIAGNOSIS — N400 Enlarged prostate without lower urinary tract symptoms: Secondary | ICD-10-CM | POA: Diagnosis not present

## 2014-09-29 DIAGNOSIS — E782 Mixed hyperlipidemia: Secondary | ICD-10-CM | POA: Diagnosis not present

## 2014-09-29 DIAGNOSIS — Z0001 Encounter for general adult medical examination with abnormal findings: Secondary | ICD-10-CM | POA: Diagnosis not present

## 2014-09-29 DIAGNOSIS — Z86711 Personal history of pulmonary embolism: Secondary | ICD-10-CM | POA: Diagnosis not present

## 2014-09-29 DIAGNOSIS — E8881 Metabolic syndrome: Secondary | ICD-10-CM | POA: Diagnosis not present

## 2014-09-29 DIAGNOSIS — E119 Type 2 diabetes mellitus without complications: Secondary | ICD-10-CM | POA: Diagnosis not present

## 2014-09-29 DIAGNOSIS — K219 Gastro-esophageal reflux disease without esophagitis: Secondary | ICD-10-CM | POA: Diagnosis not present

## 2014-09-29 DIAGNOSIS — Z9189 Other specified personal risk factors, not elsewhere classified: Secondary | ICD-10-CM | POA: Diagnosis not present

## 2014-10-13 DIAGNOSIS — I2699 Other pulmonary embolism without acute cor pulmonale: Secondary | ICD-10-CM | POA: Diagnosis not present

## 2014-11-15 DIAGNOSIS — I2699 Other pulmonary embolism without acute cor pulmonale: Secondary | ICD-10-CM | POA: Diagnosis not present

## 2014-12-15 DIAGNOSIS — I2699 Other pulmonary embolism without acute cor pulmonale: Secondary | ICD-10-CM | POA: Diagnosis not present

## 2015-01-12 DIAGNOSIS — I2699 Other pulmonary embolism without acute cor pulmonale: Secondary | ICD-10-CM | POA: Diagnosis not present

## 2015-01-18 DIAGNOSIS — I2699 Other pulmonary embolism without acute cor pulmonale: Secondary | ICD-10-CM | POA: Diagnosis not present

## 2015-01-18 DIAGNOSIS — M1712 Unilateral primary osteoarthritis, left knee: Secondary | ICD-10-CM | POA: Diagnosis not present

## 2015-02-13 DIAGNOSIS — K210 Gastro-esophageal reflux disease with esophagitis: Secondary | ICD-10-CM | POA: Diagnosis not present

## 2015-02-13 DIAGNOSIS — I10 Essential (primary) hypertension: Secondary | ICD-10-CM | POA: Diagnosis not present

## 2015-02-13 DIAGNOSIS — E119 Type 2 diabetes mellitus without complications: Secondary | ICD-10-CM | POA: Diagnosis not present

## 2015-02-13 DIAGNOSIS — I2699 Other pulmonary embolism without acute cor pulmonale: Secondary | ICD-10-CM | POA: Diagnosis not present

## 2015-02-13 DIAGNOSIS — E782 Mixed hyperlipidemia: Secondary | ICD-10-CM | POA: Diagnosis not present

## 2015-02-20 DIAGNOSIS — Z86711 Personal history of pulmonary embolism: Secondary | ICD-10-CM | POA: Diagnosis not present

## 2015-02-20 DIAGNOSIS — K219 Gastro-esophageal reflux disease without esophagitis: Secondary | ICD-10-CM | POA: Diagnosis not present

## 2015-02-20 DIAGNOSIS — N183 Chronic kidney disease, stage 3 (moderate): Secondary | ICD-10-CM | POA: Diagnosis not present

## 2015-02-20 DIAGNOSIS — E119 Type 2 diabetes mellitus without complications: Secondary | ICD-10-CM | POA: Diagnosis not present

## 2015-02-20 DIAGNOSIS — E8881 Metabolic syndrome: Secondary | ICD-10-CM | POA: Diagnosis not present

## 2015-02-20 DIAGNOSIS — M170 Bilateral primary osteoarthritis of knee: Secondary | ICD-10-CM | POA: Diagnosis not present

## 2015-02-20 DIAGNOSIS — N400 Enlarged prostate without lower urinary tract symptoms: Secondary | ICD-10-CM | POA: Diagnosis not present

## 2015-03-13 DIAGNOSIS — I2699 Other pulmonary embolism without acute cor pulmonale: Secondary | ICD-10-CM | POA: Diagnosis not present

## 2015-03-15 DIAGNOSIS — S7002XA Contusion of left hip, initial encounter: Secondary | ICD-10-CM | POA: Diagnosis not present

## 2015-03-15 DIAGNOSIS — K219 Gastro-esophageal reflux disease without esophagitis: Secondary | ICD-10-CM | POA: Diagnosis not present

## 2015-04-13 DIAGNOSIS — I2699 Other pulmonary embolism without acute cor pulmonale: Secondary | ICD-10-CM | POA: Diagnosis not present

## 2015-04-13 DIAGNOSIS — Z23 Encounter for immunization: Secondary | ICD-10-CM | POA: Diagnosis not present

## 2015-05-11 DIAGNOSIS — I2699 Other pulmonary embolism without acute cor pulmonale: Secondary | ICD-10-CM | POA: Diagnosis not present

## 2015-05-31 DIAGNOSIS — I10 Essential (primary) hypertension: Secondary | ICD-10-CM | POA: Diagnosis not present

## 2015-05-31 DIAGNOSIS — K219 Gastro-esophageal reflux disease without esophagitis: Secondary | ICD-10-CM | POA: Diagnosis not present

## 2015-05-31 DIAGNOSIS — N183 Chronic kidney disease, stage 3 (moderate): Secondary | ICD-10-CM | POA: Diagnosis not present

## 2015-05-31 DIAGNOSIS — E782 Mixed hyperlipidemia: Secondary | ICD-10-CM | POA: Diagnosis not present

## 2015-05-31 DIAGNOSIS — E119 Type 2 diabetes mellitus without complications: Secondary | ICD-10-CM | POA: Diagnosis not present

## 2015-06-07 DIAGNOSIS — K219 Gastro-esophageal reflux disease without esophagitis: Secondary | ICD-10-CM | POA: Diagnosis not present

## 2015-06-07 DIAGNOSIS — E8881 Metabolic syndrome: Secondary | ICD-10-CM | POA: Diagnosis not present

## 2015-06-07 DIAGNOSIS — E782 Mixed hyperlipidemia: Secondary | ICD-10-CM | POA: Diagnosis not present

## 2015-06-07 DIAGNOSIS — M170 Bilateral primary osteoarthritis of knee: Secondary | ICD-10-CM | POA: Diagnosis not present

## 2015-06-07 DIAGNOSIS — F5221 Male erectile disorder: Secondary | ICD-10-CM | POA: Diagnosis not present

## 2015-06-07 DIAGNOSIS — I10 Essential (primary) hypertension: Secondary | ICD-10-CM | POA: Diagnosis not present

## 2015-06-07 DIAGNOSIS — Z86711 Personal history of pulmonary embolism: Secondary | ICD-10-CM | POA: Diagnosis not present

## 2015-06-07 DIAGNOSIS — R7301 Impaired fasting glucose: Secondary | ICD-10-CM | POA: Diagnosis not present

## 2015-06-07 DIAGNOSIS — N400 Enlarged prostate without lower urinary tract symptoms: Secondary | ICD-10-CM | POA: Diagnosis not present

## 2015-06-07 DIAGNOSIS — N183 Chronic kidney disease, stage 3 (moderate): Secondary | ICD-10-CM | POA: Diagnosis not present

## 2015-06-14 DIAGNOSIS — I2699 Other pulmonary embolism without acute cor pulmonale: Secondary | ICD-10-CM | POA: Diagnosis not present

## 2015-06-28 DIAGNOSIS — I2699 Other pulmonary embolism without acute cor pulmonale: Secondary | ICD-10-CM | POA: Diagnosis not present

## 2015-08-02 DIAGNOSIS — N410 Acute prostatitis: Secondary | ICD-10-CM | POA: Diagnosis not present

## 2015-08-02 DIAGNOSIS — Z86711 Personal history of pulmonary embolism: Secondary | ICD-10-CM | POA: Diagnosis not present

## 2015-08-02 DIAGNOSIS — M170 Bilateral primary osteoarthritis of knee: Secondary | ICD-10-CM | POA: Diagnosis not present

## 2015-08-02 DIAGNOSIS — R300 Dysuria: Secondary | ICD-10-CM | POA: Diagnosis not present

## 2015-08-08 DIAGNOSIS — I2699 Other pulmonary embolism without acute cor pulmonale: Secondary | ICD-10-CM | POA: Diagnosis not present

## 2015-08-11 DIAGNOSIS — I2699 Other pulmonary embolism without acute cor pulmonale: Secondary | ICD-10-CM | POA: Diagnosis not present

## 2015-08-15 DIAGNOSIS — I2699 Other pulmonary embolism without acute cor pulmonale: Secondary | ICD-10-CM | POA: Diagnosis not present

## 2015-08-21 DIAGNOSIS — I2699 Other pulmonary embolism without acute cor pulmonale: Secondary | ICD-10-CM | POA: Diagnosis not present

## 2015-09-20 DIAGNOSIS — I2699 Other pulmonary embolism without acute cor pulmonale: Secondary | ICD-10-CM | POA: Diagnosis not present

## 2015-10-18 DIAGNOSIS — I2699 Other pulmonary embolism without acute cor pulmonale: Secondary | ICD-10-CM | POA: Diagnosis not present

## 2015-10-19 ENCOUNTER — Other Ambulatory Visit: Payer: Self-pay | Admitting: Dermatology

## 2015-10-19 DIAGNOSIS — L570 Actinic keratosis: Secondary | ICD-10-CM | POA: Diagnosis not present

## 2015-10-19 DIAGNOSIS — C4491 Basal cell carcinoma of skin, unspecified: Secondary | ICD-10-CM

## 2015-10-19 DIAGNOSIS — C44519 Basal cell carcinoma of skin of other part of trunk: Secondary | ICD-10-CM | POA: Diagnosis not present

## 2015-10-19 DIAGNOSIS — C44319 Basal cell carcinoma of skin of other parts of face: Secondary | ICD-10-CM | POA: Diagnosis not present

## 2015-10-19 HISTORY — DX: Basal cell carcinoma of skin, unspecified: C44.91

## 2015-10-31 DIAGNOSIS — I2699 Other pulmonary embolism without acute cor pulmonale: Secondary | ICD-10-CM | POA: Diagnosis not present

## 2015-10-31 DIAGNOSIS — N183 Chronic kidney disease, stage 3 (moderate): Secondary | ICD-10-CM | POA: Diagnosis not present

## 2015-10-31 DIAGNOSIS — I10 Essential (primary) hypertension: Secondary | ICD-10-CM | POA: Diagnosis not present

## 2015-10-31 DIAGNOSIS — E8881 Metabolic syndrome: Secondary | ICD-10-CM | POA: Diagnosis not present

## 2015-10-31 DIAGNOSIS — K210 Gastro-esophageal reflux disease with esophagitis: Secondary | ICD-10-CM | POA: Diagnosis not present

## 2015-10-31 DIAGNOSIS — E782 Mixed hyperlipidemia: Secondary | ICD-10-CM | POA: Diagnosis not present

## 2015-10-31 DIAGNOSIS — E119 Type 2 diabetes mellitus without complications: Secondary | ICD-10-CM | POA: Diagnosis not present

## 2015-11-01 DIAGNOSIS — E119 Type 2 diabetes mellitus without complications: Secondary | ICD-10-CM | POA: Diagnosis not present

## 2015-11-09 DIAGNOSIS — K219 Gastro-esophageal reflux disease without esophagitis: Secondary | ICD-10-CM | POA: Diagnosis not present

## 2015-11-09 DIAGNOSIS — E782 Mixed hyperlipidemia: Secondary | ICD-10-CM | POA: Diagnosis not present

## 2015-11-09 DIAGNOSIS — Z1212 Encounter for screening for malignant neoplasm of rectum: Secondary | ICD-10-CM | POA: Diagnosis not present

## 2015-11-09 DIAGNOSIS — Z0001 Encounter for general adult medical examination with abnormal findings: Secondary | ICD-10-CM | POA: Diagnosis not present

## 2015-11-09 DIAGNOSIS — E8881 Metabolic syndrome: Secondary | ICD-10-CM | POA: Diagnosis not present

## 2015-11-09 DIAGNOSIS — N183 Chronic kidney disease, stage 3 (moderate): Secondary | ICD-10-CM | POA: Diagnosis not present

## 2015-11-09 DIAGNOSIS — I10 Essential (primary) hypertension: Secondary | ICD-10-CM | POA: Diagnosis not present

## 2015-11-09 DIAGNOSIS — M170 Bilateral primary osteoarthritis of knee: Secondary | ICD-10-CM | POA: Diagnosis not present

## 2015-11-22 DIAGNOSIS — N401 Enlarged prostate with lower urinary tract symptoms: Secondary | ICD-10-CM | POA: Diagnosis not present

## 2015-11-22 DIAGNOSIS — N410 Acute prostatitis: Secondary | ICD-10-CM | POA: Diagnosis not present

## 2015-11-29 DIAGNOSIS — I2699 Other pulmonary embolism without acute cor pulmonale: Secondary | ICD-10-CM | POA: Diagnosis not present

## 2015-11-30 DIAGNOSIS — C44319 Basal cell carcinoma of skin of other parts of face: Secondary | ICD-10-CM | POA: Diagnosis not present

## 2015-11-30 DIAGNOSIS — C44519 Basal cell carcinoma of skin of other part of trunk: Secondary | ICD-10-CM | POA: Diagnosis not present

## 2015-12-18 DIAGNOSIS — R0781 Pleurodynia: Secondary | ICD-10-CM | POA: Diagnosis not present

## 2015-12-18 DIAGNOSIS — I2699 Other pulmonary embolism without acute cor pulmonale: Secondary | ICD-10-CM | POA: Diagnosis not present

## 2015-12-18 DIAGNOSIS — Z7901 Long term (current) use of anticoagulants: Secondary | ICD-10-CM | POA: Diagnosis not present

## 2015-12-22 DIAGNOSIS — I2699 Other pulmonary embolism without acute cor pulmonale: Secondary | ICD-10-CM | POA: Diagnosis not present

## 2015-12-22 DIAGNOSIS — M25571 Pain in right ankle and joints of right foot: Secondary | ICD-10-CM | POA: Diagnosis not present

## 2015-12-22 DIAGNOSIS — R0781 Pleurodynia: Secondary | ICD-10-CM | POA: Diagnosis not present

## 2016-01-01 DIAGNOSIS — I2699 Other pulmonary embolism without acute cor pulmonale: Secondary | ICD-10-CM | POA: Diagnosis not present

## 2016-01-08 DIAGNOSIS — I2699 Other pulmonary embolism without acute cor pulmonale: Secondary | ICD-10-CM | POA: Diagnosis not present

## 2016-01-12 DIAGNOSIS — R310 Gross hematuria: Secondary | ICD-10-CM | POA: Diagnosis not present

## 2016-01-12 DIAGNOSIS — Z86711 Personal history of pulmonary embolism: Secondary | ICD-10-CM | POA: Diagnosis not present

## 2016-01-12 DIAGNOSIS — R071 Chest pain on breathing: Secondary | ICD-10-CM | POA: Diagnosis not present

## 2016-01-18 DIAGNOSIS — I2510 Atherosclerotic heart disease of native coronary artery without angina pectoris: Secondary | ICD-10-CM | POA: Diagnosis not present

## 2016-01-18 DIAGNOSIS — K5730 Diverticulosis of large intestine without perforation or abscess without bleeding: Secondary | ICD-10-CM | POA: Diagnosis not present

## 2016-01-18 DIAGNOSIS — N281 Cyst of kidney, acquired: Secondary | ICD-10-CM | POA: Diagnosis not present

## 2016-01-18 DIAGNOSIS — N200 Calculus of kidney: Secondary | ICD-10-CM | POA: Diagnosis not present

## 2016-01-18 DIAGNOSIS — R310 Gross hematuria: Secondary | ICD-10-CM | POA: Diagnosis not present

## 2016-02-08 DIAGNOSIS — I2699 Other pulmonary embolism without acute cor pulmonale: Secondary | ICD-10-CM | POA: Diagnosis not present

## 2016-02-19 DIAGNOSIS — R350 Frequency of micturition: Secondary | ICD-10-CM | POA: Diagnosis not present

## 2016-02-19 DIAGNOSIS — R310 Gross hematuria: Secondary | ICD-10-CM | POA: Diagnosis not present

## 2016-02-19 DIAGNOSIS — N401 Enlarged prostate with lower urinary tract symptoms: Secondary | ICD-10-CM | POA: Diagnosis not present

## 2016-03-07 DIAGNOSIS — I2699 Other pulmonary embolism without acute cor pulmonale: Secondary | ICD-10-CM | POA: Diagnosis not present

## 2016-03-13 DIAGNOSIS — I10 Essential (primary) hypertension: Secondary | ICD-10-CM | POA: Diagnosis not present

## 2016-03-13 DIAGNOSIS — E782 Mixed hyperlipidemia: Secondary | ICD-10-CM | POA: Diagnosis not present

## 2016-03-13 DIAGNOSIS — E8881 Metabolic syndrome: Secondary | ICD-10-CM | POA: Diagnosis not present

## 2016-03-13 DIAGNOSIS — E119 Type 2 diabetes mellitus without complications: Secondary | ICD-10-CM | POA: Diagnosis not present

## 2016-03-13 DIAGNOSIS — K210 Gastro-esophageal reflux disease with esophagitis: Secondary | ICD-10-CM | POA: Diagnosis not present

## 2016-03-19 DIAGNOSIS — K219 Gastro-esophageal reflux disease without esophagitis: Secondary | ICD-10-CM | POA: Diagnosis not present

## 2016-03-19 DIAGNOSIS — N400 Enlarged prostate without lower urinary tract symptoms: Secondary | ICD-10-CM | POA: Diagnosis not present

## 2016-03-19 DIAGNOSIS — E782 Mixed hyperlipidemia: Secondary | ICD-10-CM | POA: Diagnosis not present

## 2016-03-19 DIAGNOSIS — F5221 Male erectile disorder: Secondary | ICD-10-CM | POA: Diagnosis not present

## 2016-03-19 DIAGNOSIS — E8881 Metabolic syndrome: Secondary | ICD-10-CM | POA: Diagnosis not present

## 2016-03-19 DIAGNOSIS — I10 Essential (primary) hypertension: Secondary | ICD-10-CM | POA: Diagnosis not present

## 2016-03-19 DIAGNOSIS — Z6828 Body mass index (BMI) 28.0-28.9, adult: Secondary | ICD-10-CM | POA: Diagnosis not present

## 2016-03-19 DIAGNOSIS — N183 Chronic kidney disease, stage 3 (moderate): Secondary | ICD-10-CM | POA: Diagnosis not present

## 2016-03-28 ENCOUNTER — Other Ambulatory Visit: Payer: Self-pay

## 2016-04-08 DIAGNOSIS — I2699 Other pulmonary embolism without acute cor pulmonale: Secondary | ICD-10-CM | POA: Diagnosis not present

## 2016-04-22 DIAGNOSIS — L570 Actinic keratosis: Secondary | ICD-10-CM | POA: Diagnosis not present

## 2016-04-22 DIAGNOSIS — D239 Other benign neoplasm of skin, unspecified: Secondary | ICD-10-CM | POA: Diagnosis not present

## 2016-05-09 DIAGNOSIS — I2699 Other pulmonary embolism without acute cor pulmonale: Secondary | ICD-10-CM | POA: Diagnosis not present

## 2016-05-09 DIAGNOSIS — Z23 Encounter for immunization: Secondary | ICD-10-CM | POA: Diagnosis not present

## 2016-05-21 DIAGNOSIS — M545 Low back pain: Secondary | ICD-10-CM | POA: Diagnosis not present

## 2016-05-21 DIAGNOSIS — Z86711 Personal history of pulmonary embolism: Secondary | ICD-10-CM | POA: Diagnosis not present

## 2016-05-21 DIAGNOSIS — Z6828 Body mass index (BMI) 28.0-28.9, adult: Secondary | ICD-10-CM | POA: Diagnosis not present

## 2016-06-07 DIAGNOSIS — N138 Other obstructive and reflux uropathy: Secondary | ICD-10-CM | POA: Diagnosis not present

## 2016-06-07 DIAGNOSIS — N401 Enlarged prostate with lower urinary tract symptoms: Secondary | ICD-10-CM | POA: Diagnosis not present

## 2016-06-21 DIAGNOSIS — I2699 Other pulmonary embolism without acute cor pulmonale: Secondary | ICD-10-CM | POA: Diagnosis not present

## 2016-07-10 DIAGNOSIS — E782 Mixed hyperlipidemia: Secondary | ICD-10-CM | POA: Diagnosis not present

## 2016-07-10 DIAGNOSIS — E119 Type 2 diabetes mellitus without complications: Secondary | ICD-10-CM | POA: Diagnosis not present

## 2016-07-10 DIAGNOSIS — N183 Chronic kidney disease, stage 3 (moderate): Secondary | ICD-10-CM | POA: Diagnosis not present

## 2016-07-10 DIAGNOSIS — K210 Gastro-esophageal reflux disease with esophagitis: Secondary | ICD-10-CM | POA: Diagnosis not present

## 2016-07-10 DIAGNOSIS — I10 Essential (primary) hypertension: Secondary | ICD-10-CM | POA: Diagnosis not present

## 2016-07-10 DIAGNOSIS — I2699 Other pulmonary embolism without acute cor pulmonale: Secondary | ICD-10-CM | POA: Diagnosis not present

## 2016-07-10 DIAGNOSIS — E8881 Metabolic syndrome: Secondary | ICD-10-CM | POA: Diagnosis not present

## 2016-07-15 DIAGNOSIS — N183 Chronic kidney disease, stage 3 (moderate): Secondary | ICD-10-CM | POA: Diagnosis not present

## 2016-07-15 DIAGNOSIS — K219 Gastro-esophageal reflux disease without esophagitis: Secondary | ICD-10-CM | POA: Diagnosis not present

## 2016-07-15 DIAGNOSIS — M170 Bilateral primary osteoarthritis of knee: Secondary | ICD-10-CM | POA: Diagnosis not present

## 2016-07-15 DIAGNOSIS — F5221 Male erectile disorder: Secondary | ICD-10-CM | POA: Diagnosis not present

## 2016-07-15 DIAGNOSIS — E782 Mixed hyperlipidemia: Secondary | ICD-10-CM | POA: Diagnosis not present

## 2016-07-15 DIAGNOSIS — N400 Enlarged prostate without lower urinary tract symptoms: Secondary | ICD-10-CM | POA: Diagnosis not present

## 2016-07-15 DIAGNOSIS — E8881 Metabolic syndrome: Secondary | ICD-10-CM | POA: Diagnosis not present

## 2016-07-15 DIAGNOSIS — I10 Essential (primary) hypertension: Secondary | ICD-10-CM | POA: Diagnosis not present

## 2016-07-26 DIAGNOSIS — I2699 Other pulmonary embolism without acute cor pulmonale: Secondary | ICD-10-CM | POA: Diagnosis not present

## 2016-08-23 DIAGNOSIS — I2699 Other pulmonary embolism without acute cor pulmonale: Secondary | ICD-10-CM | POA: Diagnosis not present

## 2016-09-16 DIAGNOSIS — I2699 Other pulmonary embolism without acute cor pulmonale: Secondary | ICD-10-CM | POA: Diagnosis not present

## 2016-10-09 ENCOUNTER — Other Ambulatory Visit: Payer: Self-pay | Admitting: Dermatology

## 2016-10-09 DIAGNOSIS — C44319 Basal cell carcinoma of skin of other parts of face: Secondary | ICD-10-CM | POA: Diagnosis not present

## 2016-10-09 DIAGNOSIS — L570 Actinic keratosis: Secondary | ICD-10-CM | POA: Diagnosis not present

## 2016-10-09 DIAGNOSIS — D229 Melanocytic nevi, unspecified: Secondary | ICD-10-CM | POA: Diagnosis not present

## 2016-10-14 DIAGNOSIS — N401 Enlarged prostate with lower urinary tract symptoms: Secondary | ICD-10-CM | POA: Diagnosis not present

## 2016-10-14 DIAGNOSIS — N398 Other specified disorders of urinary system: Secondary | ICD-10-CM | POA: Diagnosis not present

## 2016-10-14 DIAGNOSIS — N281 Cyst of kidney, acquired: Secondary | ICD-10-CM | POA: Diagnosis not present

## 2016-10-14 DIAGNOSIS — N200 Calculus of kidney: Secondary | ICD-10-CM | POA: Diagnosis not present

## 2016-10-14 DIAGNOSIS — N138 Other obstructive and reflux uropathy: Secondary | ICD-10-CM | POA: Diagnosis not present

## 2016-10-14 DIAGNOSIS — I2699 Other pulmonary embolism without acute cor pulmonale: Secondary | ICD-10-CM | POA: Diagnosis not present

## 2016-10-29 DIAGNOSIS — N400 Enlarged prostate without lower urinary tract symptoms: Secondary | ICD-10-CM | POA: Diagnosis not present

## 2016-10-29 DIAGNOSIS — Z9189 Other specified personal risk factors, not elsewhere classified: Secondary | ICD-10-CM | POA: Diagnosis not present

## 2016-10-29 DIAGNOSIS — E782 Mixed hyperlipidemia: Secondary | ICD-10-CM | POA: Diagnosis not present

## 2016-10-29 DIAGNOSIS — K210 Gastro-esophageal reflux disease with esophagitis: Secondary | ICD-10-CM | POA: Diagnosis not present

## 2016-10-29 DIAGNOSIS — N183 Chronic kidney disease, stage 3 (moderate): Secondary | ICD-10-CM | POA: Diagnosis not present

## 2016-10-29 DIAGNOSIS — I10 Essential (primary) hypertension: Secondary | ICD-10-CM | POA: Diagnosis not present

## 2016-10-29 DIAGNOSIS — E8881 Metabolic syndrome: Secondary | ICD-10-CM | POA: Diagnosis not present

## 2016-10-29 DIAGNOSIS — E119 Type 2 diabetes mellitus without complications: Secondary | ICD-10-CM | POA: Diagnosis not present

## 2016-11-01 DIAGNOSIS — F5221 Male erectile disorder: Secondary | ICD-10-CM | POA: Diagnosis not present

## 2016-11-01 DIAGNOSIS — K219 Gastro-esophageal reflux disease without esophagitis: Secondary | ICD-10-CM | POA: Diagnosis not present

## 2016-11-01 DIAGNOSIS — N183 Chronic kidney disease, stage 3 (moderate): Secondary | ICD-10-CM | POA: Diagnosis not present

## 2016-11-01 DIAGNOSIS — Z23 Encounter for immunization: Secondary | ICD-10-CM | POA: Diagnosis not present

## 2016-11-01 DIAGNOSIS — E782 Mixed hyperlipidemia: Secondary | ICD-10-CM | POA: Diagnosis not present

## 2016-11-01 DIAGNOSIS — I10 Essential (primary) hypertension: Secondary | ICD-10-CM | POA: Diagnosis not present

## 2016-11-01 DIAGNOSIS — E8881 Metabolic syndrome: Secondary | ICD-10-CM | POA: Diagnosis not present

## 2016-11-01 DIAGNOSIS — N400 Enlarged prostate without lower urinary tract symptoms: Secondary | ICD-10-CM | POA: Diagnosis not present

## 2016-11-05 ENCOUNTER — Encounter (INDEPENDENT_AMBULATORY_CARE_PROVIDER_SITE_OTHER): Payer: Self-pay

## 2016-11-05 ENCOUNTER — Encounter (INDEPENDENT_AMBULATORY_CARE_PROVIDER_SITE_OTHER): Payer: Self-pay | Admitting: Internal Medicine

## 2016-11-07 DIAGNOSIS — C44319 Basal cell carcinoma of skin of other parts of face: Secondary | ICD-10-CM | POA: Diagnosis not present

## 2016-11-15 DIAGNOSIS — I2699 Other pulmonary embolism without acute cor pulmonale: Secondary | ICD-10-CM | POA: Diagnosis not present

## 2016-11-15 DIAGNOSIS — Z0001 Encounter for general adult medical examination with abnormal findings: Secondary | ICD-10-CM | POA: Diagnosis not present

## 2016-11-19 ENCOUNTER — Ambulatory Visit (INDEPENDENT_AMBULATORY_CARE_PROVIDER_SITE_OTHER): Payer: Medicare Other | Admitting: Internal Medicine

## 2016-12-06 DIAGNOSIS — N138 Other obstructive and reflux uropathy: Secondary | ICD-10-CM | POA: Diagnosis not present

## 2016-12-06 DIAGNOSIS — N200 Calculus of kidney: Secondary | ICD-10-CM | POA: Diagnosis not present

## 2016-12-06 DIAGNOSIS — N401 Enlarged prostate with lower urinary tract symptoms: Secondary | ICD-10-CM | POA: Diagnosis not present

## 2016-12-12 ENCOUNTER — Encounter (INDEPENDENT_AMBULATORY_CARE_PROVIDER_SITE_OTHER): Payer: Self-pay | Admitting: *Deleted

## 2016-12-12 ENCOUNTER — Encounter (INDEPENDENT_AMBULATORY_CARE_PROVIDER_SITE_OTHER): Payer: Self-pay | Admitting: Internal Medicine

## 2016-12-12 ENCOUNTER — Ambulatory Visit (INDEPENDENT_AMBULATORY_CARE_PROVIDER_SITE_OTHER): Payer: Medicare Other | Admitting: Internal Medicine

## 2016-12-12 ENCOUNTER — Other Ambulatory Visit (INDEPENDENT_AMBULATORY_CARE_PROVIDER_SITE_OTHER): Payer: Self-pay | Admitting: Internal Medicine

## 2016-12-12 ENCOUNTER — Encounter (INDEPENDENT_AMBULATORY_CARE_PROVIDER_SITE_OTHER): Payer: Self-pay

## 2016-12-12 VITALS — BP 120/72 | HR 64 | Temp 97.8°F | Ht 74.0 in | Wt 213.5 lb

## 2016-12-12 DIAGNOSIS — D369 Benign neoplasm, unspecified site: Secondary | ICD-10-CM | POA: Insufficient documentation

## 2016-12-12 DIAGNOSIS — D131 Benign neoplasm of stomach: Secondary | ICD-10-CM

## 2016-12-12 NOTE — Patient Instructions (Signed)
EGD. The risks and benefits such as perforation, bleeding, and infection were reviewed with the patient and is agreeable. 

## 2016-12-12 NOTE — Progress Notes (Signed)
   Subjective:    Patient ID: Ronald Valentine, male    DOB: 06-30-1941, 76 y.o.   MRN: [id number]  HPI Here today for f/u. He is over due for surveillance for tubular adenoma in duodenum. He is doing good. Appetite is good. No weight loss. He has a BM x 1 a day. No melena or BRRB.  Hx of PE and maintained on Warfarin.  No dysphagia.  10/17/2011 Colonoscopy withsnare polypectomy:  Impression:  Examination performed to cecum. Left-sided diverticulosis. 6 mm polyp snared from distal transverse colon. External hemorrhoids Biopsy: Tubular adenoma. Next colonoscopy in 7 years.  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 Stricture dilated by passing 54 French Maloney dilator. Please note patient did not give history of dysphagia but I was concerned that he would start having dysphagia soon. Erosive gastritis possibly secondary to NSAID. 4 mm island of abnormal appearing duodenal mucosa next the scar. Endoscopically it did not appear to be an adenoma. It was biopsied for histology.  Duodenal polyp is a tubular adenoma. Results reviewed the patient's wife as he is not home. Repeat EGD in one year  Review of Systems Past Medical History:  Diagnosis Date  . Arthritis   . Colon polyp   . GERD (gastroesophageal reflux disease)   . Hypertension   . Kidney stones   . Left leg DVT ([place])   . Pulmonary embolism, bilateral [facility])     Past Surgical History:  Procedure Laterality Date  . COLONOSCOPY    . COLONOSCOPY  10/17/2011   Procedure: COLONOSCOPY;  Surgeon: Rogene Houston, MD;  [facility];  Service: Endoscopy;  Laterality: N/A;  930  . ESOPHAGOGASTRODUODENOSCOPY  12/26/2011   Procedure: ESOPHAGOGASTRODUODENOSCOPY (EGD);  Surgeon: Rogene Houston, MD;  [facility];  Service: Endoscopy;  Laterality: N/A;  730  . LITHOTRIPSY      No Known Allergies  Current Outpatient Prescriptions on File Prior  to Visit  Medication Sig Dispense Refill  . lisinopril-hydrochlorothiazide (PRINZIDE,ZESTORETIC) 20-12.5 MG per tablet Take 1 tablet by mouth daily.    Marland Kitchen warfarin (COUMADIN) 5 MG tablet Take 7.5 mg by mouth daily. As directed per Dr Gar Ponto 7.5mg , 7.5mg  then 3.75. Then start back.    . Melatonin 3 MG TABS Take by mouth.    . pantoprazole (PROTONIX) 40 MG tablet Take 40 mg by mouth every other day.     No current facility-administered medications on file prior to visit.        Objective:   Physical Exam Blood pressure 120/72, pulse 64, temperature 97.8 F (36.6 C), height 6\' 2"  (1.88 m), weight 213 lb 8 oz (96.8 kg). Alert and oriented. Skin warm and dry. Oral mucosa is moist.   . Sclera anicteric, conjunctivae is pink. Thyroid not enlarged. No cervical lymphadenopathy. Lungs clear. Heart regular rate and rhythm.  Abdomen is soft. Bowel sounds are positive. No hepatomegaly. No abdominal masses felt. No tenderness.  No edema to lower extremities. .        Assessment & Plan:  Tubular adenoma:Needs surveilance EGD.  The risks and benefits such as perforation, bleeding, and infection were reviewed with the patient and is agreeable.

## 2016-12-13 ENCOUNTER — Telehealth (INDEPENDENT_AMBULATORY_CARE_PROVIDER_SITE_OTHER): Payer: Self-pay | Admitting: *Deleted

## 2016-12-13 NOTE — Telephone Encounter (Signed)
Per Dr Quillian Quince office, they with bridge with Lovenox, Dr Olena Heckle office will set this up and contact patient

## 2016-12-17 DIAGNOSIS — I2699 Other pulmonary embolism without acute cor pulmonale: Secondary | ICD-10-CM | POA: Diagnosis not present

## 2017-01-13 DIAGNOSIS — I2699 Other pulmonary embolism without acute cor pulmonale: Secondary | ICD-10-CM | POA: Diagnosis not present

## 2017-02-13 DIAGNOSIS — I2699 Other pulmonary embolism without acute cor pulmonale: Secondary | ICD-10-CM | POA: Diagnosis not present

## 2017-02-20 ENCOUNTER — Encounter (HOSPITAL_COMMUNITY): Payer: Self-pay

## 2017-02-20 ENCOUNTER — Encounter (HOSPITAL_COMMUNITY)
Admission: RE | Disposition: A | Discharge status: Home / Self Care | Payer: Self-pay | Source: Ambulatory Visit | Attending: Internal Medicine

## 2017-02-20 ENCOUNTER — Ambulatory Visit (HOSPITAL_COMMUNITY)
Admission: RE | Admit: 2017-02-20 | Discharge: 2017-02-20 | Disposition: A | Discharge status: Home / Self Care | Payer: Medicare Other | Source: Ambulatory Visit | Attending: Internal Medicine | Admitting: Internal Medicine

## 2017-02-20 DIAGNOSIS — D132 Benign neoplasm of duodenum: Secondary | ICD-10-CM | POA: Insufficient documentation

## 2017-02-20 DIAGNOSIS — Z79899 Other long term (current) drug therapy: Secondary | ICD-10-CM | POA: Diagnosis not present

## 2017-02-20 DIAGNOSIS — K222 Esophageal obstruction: Secondary | ICD-10-CM | POA: Diagnosis not present

## 2017-02-20 DIAGNOSIS — K449 Diaphragmatic hernia without obstruction or gangrene: Secondary | ICD-10-CM | POA: Diagnosis not present

## 2017-02-20 DIAGNOSIS — Z09 Encounter for follow-up examination after completed treatment for conditions other than malignant neoplasm: Secondary | ICD-10-CM | POA: Diagnosis not present

## 2017-02-20 DIAGNOSIS — Z1211 Encounter for screening for malignant neoplasm of colon: Secondary | ICD-10-CM | POA: Insufficient documentation

## 2017-02-20 DIAGNOSIS — D369 Benign neoplasm, unspecified site: Secondary | ICD-10-CM | POA: Insufficient documentation

## 2017-02-20 DIAGNOSIS — Z7901 Long term (current) use of anticoagulants: Secondary | ICD-10-CM | POA: Insufficient documentation

## 2017-02-20 DIAGNOSIS — K317 Polyp of stomach and duodenum: Secondary | ICD-10-CM | POA: Diagnosis not present

## 2017-02-20 DIAGNOSIS — I10 Essential (primary) hypertension: Secondary | ICD-10-CM | POA: Insufficient documentation

## 2017-02-20 DIAGNOSIS — K219 Gastro-esophageal reflux disease without esophagitis: Secondary | ICD-10-CM | POA: Insufficient documentation

## 2017-02-20 DIAGNOSIS — K31819 Angiodysplasia of stomach and duodenum without bleeding: Secondary | ICD-10-CM | POA: Insufficient documentation

## 2017-02-20 DIAGNOSIS — Z86711 Personal history of pulmonary embolism: Secondary | ICD-10-CM | POA: Diagnosis not present

## 2017-02-20 DIAGNOSIS — Z86718 Personal history of other venous thrombosis and embolism: Secondary | ICD-10-CM | POA: Insufficient documentation

## 2017-02-20 HISTORY — PX: BIOPSY: SHX5522

## 2017-02-20 HISTORY — PX: ESOPHAGOGASTRODUODENOSCOPY: SHX5428

## 2017-02-20 SURGERY — EGD (ESOPHAGOGASTRODUODENOSCOPY)
Anesthesia: Moderate Sedation

## 2017-02-20 MED ORDER — SODIUM CHLORIDE 0.9 % IV SOLN
INTRAVENOUS | Status: DC
  Administered 2017-02-20: 1000 mL via INTRAVENOUS

## 2017-02-20 MED ORDER — MEPERIDINE HCL 50 MG/ML IJ SOLN
INTRAMUSCULAR | Status: AC
  Filled 2017-02-20: qty 1

## 2017-02-20 MED ORDER — MEPERIDINE HCL 50 MG/ML IJ SOLN
INTRAMUSCULAR | Status: DC | PRN
  Administered 2017-02-20 (×2): 25 mg via INTRAVENOUS

## 2017-02-20 MED ORDER — MIDAZOLAM HCL 5 MG/5ML IJ SOLN
INTRAMUSCULAR | Status: DC | PRN
  Administered 2017-02-20 (×3): 1 mg via INTRAVENOUS
  Administered 2017-02-20 (×2): 2 mg via INTRAVENOUS

## 2017-02-20 MED ORDER — MIDAZOLAM HCL 5 MG/5ML IJ SOLN
INTRAMUSCULAR | Status: AC
  Filled 2017-02-20: qty 10

## 2017-02-20 MED ORDER — LIDOCAINE VISCOUS 2 % MT SOLN
OROMUCOSAL | Status: AC
  Filled 2017-02-20: qty 15

## 2017-02-20 MED ORDER — STERILE WATER FOR IRRIGATION IR SOLN
Status: DC | PRN
  Administered 2017-02-20: 12:00:00

## 2017-02-20 NOTE — Op Note (Signed)
[facility] Patient Name: Ronald Valentine Procedure Date: 02/20/2017 11:40 AM MRN: [id number] Date of Birth: 1940-09-05 Attending MD: Hildred Laser , MD CSN: [id number] Age: 76 Admit Type: Outpatient Procedure:                Upper GI endoscopy Indications:              Follow-up of polyps in the duodenum, For therapy of                            polyps in the duodenum Providers:                Hildred Laser, MD, Hinton Rao, RN, Bonnetta Barry,                            Technician Referring MD:             Mitzie Na. Daniel MD, MD Medicines:                Lidocaine spray, Meperidine 50 mg IV, Midazolam 7                            mg IV Complications:            No immediate complications. Estimated Blood Loss:     Estimated blood loss was minimal. Procedure:                Pre-Anesthesia Assessment:                           - Prior to the procedure, a History and Physical                            was performed, and patient medications and                            allergies were reviewed. The patient's tolerance of                            previous anesthesia was also reviewed. The risks                            and benefits of the procedure and the sedation                            options and risks were discussed with the patient.                            All questions were answered, and informed consent                            was obtained. Prior Anticoagulants: The patient                            last took Coumadin (warfarin) 4 days and Lovenox                            (  enoxaparin) 1 day prior to the procedure. ASA                            Grade Assessment: II - A patient with mild systemic                            disease. After reviewing the risks and benefits,                            the patient was deemed in satisfactory condition to                            undergo the procedure.                           After obtaining informed consent, the  endoscope was                            passed under direct vision. Throughout the                            procedure, the patient's blood pressure, pulse, and                            oxygen saturations were monitored continuously. The                            EG-[id number] ([id number]) scope was introduced through the                            mouth, and advanced to the second part of duodenum.                            The upper GI endoscopy was accomplished without                            difficulty. The patient tolerated the procedure                            well. Scope In: 11:55:23 AM Scope Out: 12:15:51 PM Total Procedure Duration: 0 hours 20 minutes 28 seconds  Findings:      One mild benign-appearing, intrinsic stenosis was found 40 cm from the       incisors. This measured 1.3 cm (inner diameter) x less than one cm (in       length) and was traversed. The scope was withdrawn. Dilation was       performed with a Maloney dilator with no resistance at 44 Fr. The       dilation site was examined following endoscope reinsertion and showed       moderate improvement in luminal narrowing.      A 2 cm hiatal hernia was present.      Two diminutive no bleeding angiodysplastic lesions were found in the       gastric body.      The exam of the  stomach was otherwise normal.      The duodenal bulb was normal.      Localized granular mucosa was found in the second portion of the       duodenum. Biopsies were taken with a cold forceps for histology. The       pathology specimen was placed into Bottle Number 1.      A single small sessile polyp was found in the second portion of the       duodenum. Biopsies were taken with a cold forceps for histology. 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 Dilated.                           - 2 cm hiatal hernia.                           - Two non-bleeding tiny angiodysplastic  lesions in                            the stomach. these lesions were not bleeding and                            were left alone.                           - Normal duodenal bulb.                           - Granular mucosa in the second portion of the                            duodenum. Biopsied.                           - A single duodenal polyp. Biopsied. Moderate Sedation:      Moderate (conscious) sedation was administered by the endoscopy nurse       and supervised by the endoscopist. The following parameters were       monitored: oxygen saturation, heart rate, blood pressure, CO2       capnography and response to care. Total physician intraservice time was       28 minutes. Recommendation:           - Patient has a contact number available for                            emergencies. The signs and symptoms of potential                            delayed complications were discussed with the                            patient. Return to normal activities tomorrow.  Written discharge instructions were provided to the                            patient.                           - Resume previous diet today.                           - Continue present medications.                           - Resume Coumadin (warfarin) today and Lovenox                            (enoxaparin) tomorrow at prior doses. Refer to                            [facility]in [facility] for further adjustment of therapy.                           - Await pathology results. Procedure Code(s):        --- Professional ---                           [phone], Esophagogastroduodenoscopy, flexible,                            transoral; with biopsy, single or multiple                           43450, Dilation of esophagus, by unguided sound or                            bougie, single or multiple passes                           99152, Moderate sedation services provided by the                             same physician or other qualified health care                            professional performing the diagnostic or                            therapeutic service that the sedation supports,                            requiring the presence of an independent trained                            observer to assist in the monitoring of the                            patient's level of consciousness  and physiological                            status; initial 15 minutes of intraservice time,                            patient age 70 years or older                           [phone], Moderate sedation services; each additional                            15 minutes intraservice time Diagnosis Code(s):        --- Professional ---                           K22.2, Esophageal obstruction                           K44.9, Diaphragmatic hernia without obstruction or                            gangrene                           K31.819, Angiodysplasia of stomach and duodenum                            without bleeding                           K31.89, Other diseases of stomach and duodenum                           K31.7, Polyp of stomach and duodenum CPT copyright 2016 American Medical Association. All rights reserved. The codes documented in this report are preliminary and upon coder review may  be revised to meet current compliance requirements. Hildred Laser, MD Hildred Laser, MD 02/20/2017 12:30:49 PM This report has been signed electronically. Number of Addenda: 0

## 2017-02-20 NOTE — Discharge Instructions (Signed)
° ° ° °  Esophagogastroduodenoscopy, Care After Refer to this sheet in the next few weeks. These instructions provide you with information about caring for yourself after your procedure. Your health care provider may also give you more specific instructions. Your treatment has been planned according to current medical practices, but problems sometimes occur. Call your health care provider if you have any problems or questions after your procedure. What can I expect after the procedure? After the procedure, it is common to have:  A sore throat.  Nausea.  Bloating.  Dizziness.  Fatigue.  Follow these instructions at home:  Do not eat or drink anything until the numbing medicine (local anesthetic) has worn off and your gag reflex has returned. You will know that the local anesthetic has worn off when you can swallow comfortably.  Do not drive for 24 hours if you received a medicine to help you relax (sedative).  If your health care provider took a tissue sample for testing during the procedure, make sure to get your test results. This is your responsibility. Ask your health care provider or the department performing the test when your results will be ready.  Keep all follow-up visits as told by your health care provider. This is important. Contact a health care provider if:  You cannot stop coughing.  You are not urinating.  You are urinating less than usual. Get help right away if:  You have trouble swallowing.  You cannot eat or drink.  You have throat or chest pain that gets worse.  You are dizzy or light-headed.  You faint.  You have nausea or vomiting.  You have chills.  You have a fever.  You have severe abdominal pain.  You have black, tarry, or bloody stools. This information is not intended to replace advice given to you by your health care provider. Make sure you discuss any questions you have with your health care provider. Document Released: 07/01/2012  Document Revised: 12/21/2015 Document Reviewed: 06/08/2015 Elsevier Interactive Patient Education  2018 [employer]. Resume warfarin at usual dose starting this evening and get INR checked next week as planned. Resume Lovenox on 02/21/2017 and continue for 3 days. Resume other medications and diet as before. No driving for 24 hours. Physician will call with biopsy results.

## 2017-02-20 NOTE — H&P (Signed)
Ronald Valentine is an 76 y.o. male.   Chief Complaint: Patient is here for EGD. HPI: Patient is 76 year old Caucasian male with history of duodenal adenoma and is here for surveillance EGD. Last exam in March 2013 when residual adenoma was removed. He feels fine. He denies heartburn dysphagia abdominal pain or melena. At times he feels as if he has lump on the left side of his throat. He wants me to look at this area carefully. He has been off warfarin for 4 days. He is being bridged with Lovenox and last dose was yesterday.  Past Medical History:  Diagnosis Date  . Arthritis   . Colon polyp   . GERD (gastroesophageal reflux disease)   . Hypertension   . Kidney stones   . Left leg DVT ([place])   . Pulmonary embolism, bilateral [facility])     Past Surgical History:  Procedure Laterality Date  . COLONOSCOPY    . COLONOSCOPY  10/17/2011   Procedure: COLONOSCOPY;  Surgeon: Rogene Houston, MD;  [facility];  Service: Endoscopy;  Laterality: N/A;  930  . ESOPHAGOGASTRODUODENOSCOPY  12/26/2011   Procedure: ESOPHAGOGASTRODUODENOSCOPY (EGD);  Surgeon: Rogene Houston, MD;  [facility];  Service: Endoscopy;  Laterality: N/A;  730  . LITHOTRIPSY      Family History  Problem Relation Age of Onset  . Hypertension Mother   . Colon cancer Neg Hx    Social History:  reports that he has never smoked. He has never used smokeless tobacco. He reports that he does not drink alcohol or use drugs.  Allergies: No Known Allergies  Medications Prior to Admission  Medication Sig Dispense Refill  . acetaminophen (TYLENOL) 650 MG CR tablet Take 650-1,300 mg by mouth daily as needed for pain.    Marland Kitchen enoxaparin (LOVENOX) 150 MG/ML injection Inject 150 mg into the skin daily at 6 PM.    . FIBER PO Take 2 tablets by mouth daily.    . finasteride (PROSCAR) 5 MG tablet Take 5 mg by mouth every other day.     . lisinopril-hydrochlorothiazide (PRINZIDE,ZESTORETIC) 20-12.5 MG per tablet Take 1 tablet  by mouth daily.    . mirtazapine (REMERON) 7.5 MG tablet Take 7.5 mg by mouth at bedtime.    . Multiple Vitamins-Minerals (MULTIVITAMIN ADULTS PO) Take 1 tablet by mouth daily.     Marland Kitchen omeprazole (PRILOSEC) 20 MG capsule Take 20 mg by mouth every other day.     . tamsulosin (FLOMAX) 0.4 MG CAPS capsule Take 0.4 mg by mouth at bedtime.     . verapamil (VERELAN PM) 240 MG 24 hr capsule Take 240 mg by mouth daily.     Marland Kitchen warfarin (COUMADIN) 5 MG tablet Take 7.5 mg by mouth daily. As directed per Dr Gar Ponto, Take 7.5mg  daily on days one and two, then take 3.75 mg on day 3 then repeat      No results found for this or any previous visit (from the past 48 hour(s)). No results found.  ROS  Blood pressure (!) 156/86, pulse 81, temperature 97.7 F (36.5 C), temperature source Oral, resp. rate 14, height 6\' 2"  (1.88 m), weight 214 lb (97.1 kg), SpO2 95 %. Physical Exam  Constitutional: He appears well-developed and well-nourished.  HENT:  Mouth/Throat: Oropharynx is clear and moist.  Eyes: Conjunctivae are normal. No scleral icterus.  Neck: No thyromegaly present.  Cardiovascular: Normal rate, regular rhythm and normal heart sounds.   No murmur heard. Respiratory: Effort  normal and breath sounds normal.  GI:  Abdomen is full and soft. There is small submucosal lesion below the left costal margin. It slips. Features typical of lipoma. No organomegaly or masses.  Musculoskeletal: He exhibits no edema.  Lymphadenopathy:    He has no cervical adenopathy.  Skin: Skin is warm and dry.     Assessment/Plan History of duodenal adenoma. Surveillance EGD.  Hildred Laser, MD 02/20/2017, 11:40 AM

## 2017-02-24 DIAGNOSIS — I2699 Other pulmonary embolism without acute cor pulmonale: Secondary | ICD-10-CM | POA: Diagnosis not present

## 2017-02-25 ENCOUNTER — Encounter (HOSPITAL_COMMUNITY): Payer: Self-pay | Admitting: Internal Medicine

## 2017-02-26 DIAGNOSIS — I2699 Other pulmonary embolism without acute cor pulmonale: Secondary | ICD-10-CM | POA: Diagnosis not present

## 2017-02-28 DIAGNOSIS — N138 Other obstructive and reflux uropathy: Secondary | ICD-10-CM | POA: Diagnosis not present

## 2017-02-28 DIAGNOSIS — N200 Calculus of kidney: Secondary | ICD-10-CM | POA: Diagnosis not present

## 2017-02-28 DIAGNOSIS — N401 Enlarged prostate with lower urinary tract symptoms: Secondary | ICD-10-CM | POA: Diagnosis not present

## 2017-02-28 DIAGNOSIS — N281 Cyst of kidney, acquired: Secondary | ICD-10-CM | POA: Diagnosis not present

## 2017-02-28 DIAGNOSIS — N398 Other specified disorders of urinary system: Secondary | ICD-10-CM | POA: Diagnosis not present

## 2017-02-28 DIAGNOSIS — R350 Frequency of micturition: Secondary | ICD-10-CM | POA: Diagnosis not present

## 2017-03-03 DIAGNOSIS — I2699 Other pulmonary embolism without acute cor pulmonale: Secondary | ICD-10-CM | POA: Diagnosis not present

## 2017-03-10 DIAGNOSIS — H40053 Ocular hypertension, bilateral: Secondary | ICD-10-CM | POA: Diagnosis not present

## 2017-03-11 DIAGNOSIS — I2699 Other pulmonary embolism without acute cor pulmonale: Secondary | ICD-10-CM | POA: Diagnosis not present

## 2017-03-11 DIAGNOSIS — Z23 Encounter for immunization: Secondary | ICD-10-CM | POA: Diagnosis not present

## 2017-03-25 DIAGNOSIS — I2699 Other pulmonary embolism without acute cor pulmonale: Secondary | ICD-10-CM | POA: Diagnosis not present

## 2017-04-04 DIAGNOSIS — E782 Mixed hyperlipidemia: Secondary | ICD-10-CM | POA: Diagnosis not present

## 2017-04-04 DIAGNOSIS — K210 Gastro-esophageal reflux disease with esophagitis: Secondary | ICD-10-CM | POA: Diagnosis not present

## 2017-04-04 DIAGNOSIS — E119 Type 2 diabetes mellitus without complications: Secondary | ICD-10-CM | POA: Diagnosis not present

## 2017-04-04 DIAGNOSIS — N183 Chronic kidney disease, stage 3 (moderate): Secondary | ICD-10-CM | POA: Diagnosis not present

## 2017-04-04 DIAGNOSIS — Z9189 Other specified personal risk factors, not elsewhere classified: Secondary | ICD-10-CM | POA: Diagnosis not present

## 2017-04-04 DIAGNOSIS — I10 Essential (primary) hypertension: Secondary | ICD-10-CM | POA: Diagnosis not present

## 2017-04-08 DIAGNOSIS — K219 Gastro-esophageal reflux disease without esophagitis: Secondary | ICD-10-CM | POA: Diagnosis not present

## 2017-04-08 DIAGNOSIS — E8881 Metabolic syndrome: Secondary | ICD-10-CM | POA: Diagnosis not present

## 2017-04-08 DIAGNOSIS — N400 Enlarged prostate without lower urinary tract symptoms: Secondary | ICD-10-CM | POA: Diagnosis not present

## 2017-04-08 DIAGNOSIS — R7301 Impaired fasting glucose: Secondary | ICD-10-CM | POA: Diagnosis not present

## 2017-04-08 DIAGNOSIS — E782 Mixed hyperlipidemia: Secondary | ICD-10-CM | POA: Diagnosis not present

## 2017-04-08 DIAGNOSIS — I10 Essential (primary) hypertension: Secondary | ICD-10-CM | POA: Diagnosis not present

## 2017-04-08 DIAGNOSIS — N183 Chronic kidney disease, stage 3 (moderate): Secondary | ICD-10-CM | POA: Diagnosis not present

## 2017-04-08 DIAGNOSIS — Z6828 Body mass index (BMI) 28.0-28.9, adult: Secondary | ICD-10-CM | POA: Diagnosis not present

## 2017-04-22 DIAGNOSIS — I2699 Other pulmonary embolism without acute cor pulmonale: Secondary | ICD-10-CM | POA: Diagnosis not present

## 2017-04-24 DIAGNOSIS — I2699 Other pulmonary embolism without acute cor pulmonale: Secondary | ICD-10-CM | POA: Diagnosis not present

## 2017-04-24 DIAGNOSIS — Z23 Encounter for immunization: Secondary | ICD-10-CM | POA: Diagnosis not present

## 2017-04-24 DIAGNOSIS — Z6827 Body mass index (BMI) 27.0-27.9, adult: Secondary | ICD-10-CM | POA: Diagnosis not present

## 2017-04-24 DIAGNOSIS — K219 Gastro-esophageal reflux disease without esophagitis: Secondary | ICD-10-CM | POA: Diagnosis not present

## 2017-04-29 DIAGNOSIS — I2699 Other pulmonary embolism without acute cor pulmonale: Secondary | ICD-10-CM | POA: Diagnosis not present

## 2017-05-06 DIAGNOSIS — I2699 Other pulmonary embolism without acute cor pulmonale: Secondary | ICD-10-CM | POA: Diagnosis not present

## 2017-05-20 DIAGNOSIS — I2699 Other pulmonary embolism without acute cor pulmonale: Secondary | ICD-10-CM | POA: Diagnosis not present

## 2017-06-17 DIAGNOSIS — I2699 Other pulmonary embolism without acute cor pulmonale: Secondary | ICD-10-CM | POA: Diagnosis not present

## 2017-07-09 DIAGNOSIS — I2699 Other pulmonary embolism without acute cor pulmonale: Secondary | ICD-10-CM | POA: Diagnosis not present

## 2017-08-06 DIAGNOSIS — I2699 Other pulmonary embolism without acute cor pulmonale: Secondary | ICD-10-CM | POA: Diagnosis not present

## 2017-08-25 DIAGNOSIS — E8881 Metabolic syndrome: Secondary | ICD-10-CM | POA: Diagnosis not present

## 2017-08-25 DIAGNOSIS — K219 Gastro-esophageal reflux disease without esophagitis: Secondary | ICD-10-CM | POA: Diagnosis not present

## 2017-08-25 DIAGNOSIS — E119 Type 2 diabetes mellitus without complications: Secondary | ICD-10-CM | POA: Diagnosis not present

## 2017-08-25 DIAGNOSIS — I10 Essential (primary) hypertension: Secondary | ICD-10-CM | POA: Diagnosis not present

## 2017-08-25 DIAGNOSIS — Z9189 Other specified personal risk factors, not elsewhere classified: Secondary | ICD-10-CM | POA: Diagnosis not present

## 2017-08-25 DIAGNOSIS — E782 Mixed hyperlipidemia: Secondary | ICD-10-CM | POA: Diagnosis not present

## 2017-08-25 DIAGNOSIS — N183 Chronic kidney disease, stage 3 (moderate): Secondary | ICD-10-CM | POA: Diagnosis not present

## 2017-08-28 DIAGNOSIS — R7301 Impaired fasting glucose: Secondary | ICD-10-CM | POA: Diagnosis not present

## 2017-08-28 DIAGNOSIS — N400 Enlarged prostate without lower urinary tract symptoms: Secondary | ICD-10-CM | POA: Diagnosis not present

## 2017-08-28 DIAGNOSIS — E8881 Metabolic syndrome: Secondary | ICD-10-CM | POA: Diagnosis not present

## 2017-08-28 DIAGNOSIS — I2699 Other pulmonary embolism without acute cor pulmonale: Secondary | ICD-10-CM | POA: Diagnosis not present

## 2017-08-28 DIAGNOSIS — Z6828 Body mass index (BMI) 28.0-28.9, adult: Secondary | ICD-10-CM | POA: Diagnosis not present

## 2017-08-28 DIAGNOSIS — E782 Mixed hyperlipidemia: Secondary | ICD-10-CM | POA: Diagnosis not present

## 2017-08-28 DIAGNOSIS — N183 Chronic kidney disease, stage 3 (moderate): Secondary | ICD-10-CM | POA: Diagnosis not present

## 2017-08-28 DIAGNOSIS — I10 Essential (primary) hypertension: Secondary | ICD-10-CM | POA: Diagnosis not present

## 2017-09-22 DIAGNOSIS — Z87448 Personal history of other diseases of urinary system: Secondary | ICD-10-CM | POA: Diagnosis not present

## 2017-09-22 DIAGNOSIS — N398 Other specified disorders of urinary system: Secondary | ICD-10-CM | POA: Diagnosis not present

## 2017-09-22 DIAGNOSIS — N528 Other male erectile dysfunction: Secondary | ICD-10-CM | POA: Diagnosis not present

## 2017-09-22 DIAGNOSIS — N401 Enlarged prostate with lower urinary tract symptoms: Secondary | ICD-10-CM | POA: Diagnosis not present

## 2017-09-22 DIAGNOSIS — N529 Male erectile dysfunction, unspecified: Secondary | ICD-10-CM | POA: Diagnosis not present

## 2017-09-22 DIAGNOSIS — C679 Malignant neoplasm of bladder, unspecified: Secondary | ICD-10-CM | POA: Diagnosis not present

## 2017-09-22 DIAGNOSIS — N281 Cyst of kidney, acquired: Secondary | ICD-10-CM | POA: Diagnosis not present

## 2017-09-22 DIAGNOSIS — N138 Other obstructive and reflux uropathy: Secondary | ICD-10-CM | POA: Diagnosis not present

## 2017-09-22 DIAGNOSIS — N200 Calculus of kidney: Secondary | ICD-10-CM | POA: Diagnosis not present

## 2017-09-22 DIAGNOSIS — N402 Nodular prostate without lower urinary tract symptoms: Secondary | ICD-10-CM | POA: Diagnosis not present

## 2017-09-26 DIAGNOSIS — N200 Calculus of kidney: Secondary | ICD-10-CM | POA: Diagnosis not present

## 2017-09-26 DIAGNOSIS — I2699 Other pulmonary embolism without acute cor pulmonale: Secondary | ICD-10-CM | POA: Diagnosis not present

## 2017-09-26 DIAGNOSIS — N401 Enlarged prostate with lower urinary tract symptoms: Secondary | ICD-10-CM | POA: Diagnosis not present

## 2017-09-26 DIAGNOSIS — N398 Other specified disorders of urinary system: Secondary | ICD-10-CM | POA: Diagnosis not present

## 2017-09-26 DIAGNOSIS — N281 Cyst of kidney, acquired: Secondary | ICD-10-CM | POA: Diagnosis not present

## 2017-09-26 DIAGNOSIS — N528 Other male erectile dysfunction: Secondary | ICD-10-CM | POA: Diagnosis not present

## 2017-09-26 DIAGNOSIS — N138 Other obstructive and reflux uropathy: Secondary | ICD-10-CM | POA: Diagnosis not present

## 2017-10-01 DIAGNOSIS — E291 Testicular hypofunction: Secondary | ICD-10-CM | POA: Diagnosis not present

## 2017-10-13 DIAGNOSIS — G319 Degenerative disease of nervous system, unspecified: Secondary | ICD-10-CM | POA: Diagnosis not present

## 2017-10-13 DIAGNOSIS — E229 Hyperfunction of pituitary gland, unspecified: Secondary | ICD-10-CM | POA: Diagnosis not present

## 2017-10-13 DIAGNOSIS — G9389 Other specified disorders of brain: Secondary | ICD-10-CM | POA: Diagnosis not present

## 2017-10-13 DIAGNOSIS — E221 Hyperprolactinemia: Secondary | ICD-10-CM | POA: Diagnosis not present

## 2017-10-20 DIAGNOSIS — E221 Hyperprolactinemia: Secondary | ICD-10-CM | POA: Diagnosis not present

## 2017-10-23 DIAGNOSIS — I2699 Other pulmonary embolism without acute cor pulmonale: Secondary | ICD-10-CM | POA: Diagnosis not present

## 2017-10-28 DIAGNOSIS — Z7901 Long term (current) use of anticoagulants: Secondary | ICD-10-CM | POA: Diagnosis not present

## 2017-10-28 DIAGNOSIS — I10 Essential (primary) hypertension: Secondary | ICD-10-CM | POA: Diagnosis not present

## 2017-10-28 DIAGNOSIS — Z79899 Other long term (current) drug therapy: Secondary | ICD-10-CM | POA: Diagnosis not present

## 2017-10-28 DIAGNOSIS — K219 Gastro-esophageal reflux disease without esophagitis: Secondary | ICD-10-CM | POA: Diagnosis not present

## 2017-10-28 DIAGNOSIS — R040 Epistaxis: Secondary | ICD-10-CM | POA: Diagnosis not present

## 2017-10-28 DIAGNOSIS — Z86718 Personal history of other venous thrombosis and embolism: Secondary | ICD-10-CM | POA: Diagnosis not present

## 2017-10-29 DIAGNOSIS — I2699 Other pulmonary embolism without acute cor pulmonale: Secondary | ICD-10-CM | POA: Diagnosis not present

## 2017-10-29 DIAGNOSIS — Z6827 Body mass index (BMI) 27.0-27.9, adult: Secondary | ICD-10-CM | POA: Diagnosis not present

## 2017-10-29 DIAGNOSIS — R040 Epistaxis: Secondary | ICD-10-CM | POA: Diagnosis not present

## 2017-10-31 DIAGNOSIS — I2699 Other pulmonary embolism without acute cor pulmonale: Secondary | ICD-10-CM | POA: Diagnosis not present

## 2017-11-13 ENCOUNTER — Other Ambulatory Visit: Payer: Self-pay | Admitting: Dermatology

## 2017-11-13 DIAGNOSIS — L820 Inflamed seborrheic keratosis: Secondary | ICD-10-CM | POA: Diagnosis not present

## 2017-11-13 DIAGNOSIS — D229 Melanocytic nevi, unspecified: Secondary | ICD-10-CM | POA: Diagnosis not present

## 2017-11-13 DIAGNOSIS — L570 Actinic keratosis: Secondary | ICD-10-CM | POA: Diagnosis not present

## 2017-11-13 DIAGNOSIS — D485 Neoplasm of uncertain behavior of skin: Secondary | ICD-10-CM | POA: Diagnosis not present

## 2017-11-17 DIAGNOSIS — Z6828 Body mass index (BMI) 28.0-28.9, adult: Secondary | ICD-10-CM | POA: Diagnosis not present

## 2017-11-17 DIAGNOSIS — E221 Hyperprolactinemia: Secondary | ICD-10-CM | POA: Diagnosis not present

## 2017-11-17 DIAGNOSIS — T162XXA Foreign body in left ear, initial encounter: Secondary | ICD-10-CM | POA: Diagnosis not present

## 2017-12-30 DIAGNOSIS — Z7901 Long term (current) use of anticoagulants: Secondary | ICD-10-CM | POA: Diagnosis not present

## 2017-12-30 DIAGNOSIS — Z86711 Personal history of pulmonary embolism: Secondary | ICD-10-CM | POA: Diagnosis not present

## 2017-12-30 DIAGNOSIS — Z9189 Other specified personal risk factors, not elsewhere classified: Secondary | ICD-10-CM | POA: Diagnosis not present

## 2017-12-30 DIAGNOSIS — E8881 Metabolic syndrome: Secondary | ICD-10-CM | POA: Diagnosis not present

## 2017-12-30 DIAGNOSIS — N183 Chronic kidney disease, stage 3 (moderate): Secondary | ICD-10-CM | POA: Diagnosis not present

## 2017-12-30 DIAGNOSIS — E782 Mixed hyperlipidemia: Secondary | ICD-10-CM | POA: Diagnosis not present

## 2017-12-30 DIAGNOSIS — E119 Type 2 diabetes mellitus without complications: Secondary | ICD-10-CM | POA: Diagnosis not present

## 2017-12-30 DIAGNOSIS — I10 Essential (primary) hypertension: Secondary | ICD-10-CM | POA: Diagnosis not present

## 2018-01-01 DIAGNOSIS — M1711 Unilateral primary osteoarthritis, right knee: Secondary | ICD-10-CM | POA: Diagnosis not present

## 2018-01-01 DIAGNOSIS — I10 Essential (primary) hypertension: Secondary | ICD-10-CM | POA: Diagnosis not present

## 2018-01-01 DIAGNOSIS — F5221 Male erectile disorder: Secondary | ICD-10-CM | POA: Diagnosis not present

## 2018-01-01 DIAGNOSIS — N400 Enlarged prostate without lower urinary tract symptoms: Secondary | ICD-10-CM | POA: Diagnosis not present

## 2018-01-01 DIAGNOSIS — E782 Mixed hyperlipidemia: Secondary | ICD-10-CM | POA: Diagnosis not present

## 2018-01-01 DIAGNOSIS — E8881 Metabolic syndrome: Secondary | ICD-10-CM | POA: Diagnosis not present

## 2018-01-01 DIAGNOSIS — Z6827 Body mass index (BMI) 27.0-27.9, adult: Secondary | ICD-10-CM | POA: Diagnosis not present

## 2018-01-01 DIAGNOSIS — Z0001 Encounter for general adult medical examination with abnormal findings: Secondary | ICD-10-CM | POA: Diagnosis not present

## 2018-01-01 DIAGNOSIS — N183 Chronic kidney disease, stage 3 (moderate): Secondary | ICD-10-CM | POA: Diagnosis not present

## 2018-01-12 DIAGNOSIS — J0101 Acute recurrent maxillary sinusitis: Secondary | ICD-10-CM | POA: Diagnosis not present

## 2018-01-12 DIAGNOSIS — Z6827 Body mass index (BMI) 27.0-27.9, adult: Secondary | ICD-10-CM | POA: Diagnosis not present

## 2018-01-12 DIAGNOSIS — J209 Acute bronchitis, unspecified: Secondary | ICD-10-CM | POA: Diagnosis not present

## 2018-01-22 DIAGNOSIS — Z6827 Body mass index (BMI) 27.0-27.9, adult: Secondary | ICD-10-CM | POA: Diagnosis not present

## 2018-01-22 DIAGNOSIS — J209 Acute bronchitis, unspecified: Secondary | ICD-10-CM | POA: Diagnosis not present

## 2018-02-25 DIAGNOSIS — L03116 Cellulitis of left lower limb: Secondary | ICD-10-CM | POA: Diagnosis not present

## 2018-02-25 DIAGNOSIS — Z6827 Body mass index (BMI) 27.0-27.9, adult: Secondary | ICD-10-CM | POA: Diagnosis not present

## 2018-02-27 ENCOUNTER — Other Ambulatory Visit: Payer: Self-pay

## 2018-03-23 DIAGNOSIS — N402 Nodular prostate without lower urinary tract symptoms: Secondary | ICD-10-CM | POA: Diagnosis not present

## 2018-03-23 DIAGNOSIS — N528 Other male erectile dysfunction: Secondary | ICD-10-CM | POA: Diagnosis not present

## 2018-03-23 DIAGNOSIS — E221 Hyperprolactinemia: Secondary | ICD-10-CM | POA: Diagnosis not present

## 2018-03-23 DIAGNOSIS — N200 Calculus of kidney: Secondary | ICD-10-CM | POA: Diagnosis not present

## 2018-03-23 DIAGNOSIS — N138 Other obstructive and reflux uropathy: Secondary | ICD-10-CM | POA: Diagnosis not present

## 2018-03-23 DIAGNOSIS — N281 Cyst of kidney, acquired: Secondary | ICD-10-CM | POA: Diagnosis not present

## 2018-03-23 DIAGNOSIS — N398 Other specified disorders of urinary system: Secondary | ICD-10-CM | POA: Diagnosis not present

## 2018-03-23 DIAGNOSIS — N401 Enlarged prostate with lower urinary tract symptoms: Secondary | ICD-10-CM | POA: Diagnosis not present

## 2018-05-04 DIAGNOSIS — N183 Chronic kidney disease, stage 3 (moderate): Secondary | ICD-10-CM | POA: Diagnosis not present

## 2018-05-04 DIAGNOSIS — I10 Essential (primary) hypertension: Secondary | ICD-10-CM | POA: Diagnosis not present

## 2018-05-04 DIAGNOSIS — E782 Mixed hyperlipidemia: Secondary | ICD-10-CM | POA: Diagnosis not present

## 2018-05-04 DIAGNOSIS — E8881 Metabolic syndrome: Secondary | ICD-10-CM | POA: Diagnosis not present

## 2018-05-04 DIAGNOSIS — K210 Gastro-esophageal reflux disease with esophagitis: Secondary | ICD-10-CM | POA: Diagnosis not present

## 2018-05-06 DIAGNOSIS — N400 Enlarged prostate without lower urinary tract symptoms: Secondary | ICD-10-CM | POA: Diagnosis not present

## 2018-05-06 DIAGNOSIS — Z6827 Body mass index (BMI) 27.0-27.9, adult: Secondary | ICD-10-CM | POA: Diagnosis not present

## 2018-05-06 DIAGNOSIS — Z86711 Personal history of pulmonary embolism: Secondary | ICD-10-CM | POA: Diagnosis not present

## 2018-05-06 DIAGNOSIS — Z23 Encounter for immunization: Secondary | ICD-10-CM | POA: Diagnosis not present

## 2018-05-06 DIAGNOSIS — E782 Mixed hyperlipidemia: Secondary | ICD-10-CM | POA: Diagnosis not present

## 2018-05-06 DIAGNOSIS — I10 Essential (primary) hypertension: Secondary | ICD-10-CM | POA: Diagnosis not present

## 2018-05-06 DIAGNOSIS — N183 Chronic kidney disease, stage 3 (moderate): Secondary | ICD-10-CM | POA: Diagnosis not present

## 2018-05-06 DIAGNOSIS — E8881 Metabolic syndrome: Secondary | ICD-10-CM | POA: Diagnosis not present

## 2018-07-27 DIAGNOSIS — K219 Gastro-esophageal reflux disease without esophagitis: Secondary | ICD-10-CM | POA: Diagnosis not present

## 2018-07-27 DIAGNOSIS — I10 Essential (primary) hypertension: Secondary | ICD-10-CM | POA: Diagnosis not present

## 2018-07-27 DIAGNOSIS — E782 Mixed hyperlipidemia: Secondary | ICD-10-CM | POA: Diagnosis not present

## 2018-07-27 DIAGNOSIS — E8881 Metabolic syndrome: Secondary | ICD-10-CM | POA: Diagnosis not present

## 2018-08-14 DIAGNOSIS — K219 Gastro-esophageal reflux disease without esophagitis: Secondary | ICD-10-CM | POA: Diagnosis not present

## 2018-08-14 DIAGNOSIS — L570 Actinic keratosis: Secondary | ICD-10-CM | POA: Diagnosis not present

## 2018-08-27 DIAGNOSIS — K219 Gastro-esophageal reflux disease without esophagitis: Secondary | ICD-10-CM | POA: Diagnosis not present

## 2018-08-27 DIAGNOSIS — I10 Essential (primary) hypertension: Secondary | ICD-10-CM | POA: Diagnosis not present

## 2018-08-27 DIAGNOSIS — E782 Mixed hyperlipidemia: Secondary | ICD-10-CM | POA: Diagnosis not present

## 2018-09-03 DIAGNOSIS — Z7901 Long term (current) use of anticoagulants: Secondary | ICD-10-CM | POA: Diagnosis not present

## 2018-09-03 DIAGNOSIS — I2699 Other pulmonary embolism without acute cor pulmonale: Secondary | ICD-10-CM | POA: Diagnosis not present

## 2018-09-03 DIAGNOSIS — E1165 Type 2 diabetes mellitus with hyperglycemia: Secondary | ICD-10-CM | POA: Diagnosis not present

## 2018-09-03 DIAGNOSIS — E782 Mixed hyperlipidemia: Secondary | ICD-10-CM | POA: Diagnosis not present

## 2018-09-08 DIAGNOSIS — I10 Essential (primary) hypertension: Secondary | ICD-10-CM | POA: Diagnosis not present

## 2018-09-08 DIAGNOSIS — N183 Chronic kidney disease, stage 3 (moderate): Secondary | ICD-10-CM | POA: Diagnosis not present

## 2018-09-08 DIAGNOSIS — T161XXA Foreign body in right ear, initial encounter: Secondary | ICD-10-CM | POA: Diagnosis not present

## 2018-09-08 DIAGNOSIS — E782 Mixed hyperlipidemia: Secondary | ICD-10-CM | POA: Diagnosis not present

## 2018-09-14 DIAGNOSIS — E221 Hyperprolactinemia: Secondary | ICD-10-CM | POA: Diagnosis not present

## 2018-09-24 ENCOUNTER — Encounter (INDEPENDENT_AMBULATORY_CARE_PROVIDER_SITE_OTHER): Payer: Self-pay | Admitting: *Deleted

## 2018-09-25 DIAGNOSIS — N200 Calculus of kidney: Secondary | ICD-10-CM | POA: Diagnosis not present

## 2018-09-25 DIAGNOSIS — N281 Cyst of kidney, acquired: Secondary | ICD-10-CM | POA: Diagnosis not present

## 2018-09-25 DIAGNOSIS — E221 Hyperprolactinemia: Secondary | ICD-10-CM | POA: Diagnosis not present

## 2018-09-25 DIAGNOSIS — N398 Other specified disorders of urinary system: Secondary | ICD-10-CM | POA: Diagnosis not present

## 2018-10-23 DIAGNOSIS — J301 Allergic rhinitis due to pollen: Secondary | ICD-10-CM | POA: Diagnosis not present

## 2018-12-26 DIAGNOSIS — E039 Hypothyroidism, unspecified: Secondary | ICD-10-CM | POA: Diagnosis not present

## 2018-12-26 DIAGNOSIS — I10 Essential (primary) hypertension: Secondary | ICD-10-CM | POA: Diagnosis not present

## 2019-01-01 DIAGNOSIS — I2699 Other pulmonary embolism without acute cor pulmonale: Secondary | ICD-10-CM | POA: Diagnosis not present

## 2019-01-01 DIAGNOSIS — E1165 Type 2 diabetes mellitus with hyperglycemia: Secondary | ICD-10-CM | POA: Diagnosis not present

## 2019-01-01 DIAGNOSIS — Z7901 Long term (current) use of anticoagulants: Secondary | ICD-10-CM | POA: Diagnosis not present

## 2019-01-01 DIAGNOSIS — E782 Mixed hyperlipidemia: Secondary | ICD-10-CM | POA: Diagnosis not present

## 2019-01-05 DIAGNOSIS — Z0001 Encounter for general adult medical examination with abnormal findings: Secondary | ICD-10-CM | POA: Diagnosis not present

## 2019-01-05 DIAGNOSIS — Z1212 Encounter for screening for malignant neoplasm of rectum: Secondary | ICD-10-CM | POA: Diagnosis not present

## 2019-01-05 DIAGNOSIS — I10 Essential (primary) hypertension: Secondary | ICD-10-CM | POA: Diagnosis not present

## 2019-01-05 DIAGNOSIS — N183 Chronic kidney disease, stage 3 (moderate): Secondary | ICD-10-CM | POA: Diagnosis not present

## 2019-01-13 ENCOUNTER — Other Ambulatory Visit: Payer: Self-pay | Admitting: Dermatology

## 2019-01-13 DIAGNOSIS — D0439 Carcinoma in situ of skin of other parts of face: Secondary | ICD-10-CM | POA: Diagnosis not present

## 2019-01-13 DIAGNOSIS — N183 Chronic kidney disease, stage 3 (moderate): Secondary | ICD-10-CM | POA: Diagnosis not present

## 2019-01-13 DIAGNOSIS — D229 Melanocytic nevi, unspecified: Secondary | ICD-10-CM | POA: Diagnosis not present

## 2019-01-13 DIAGNOSIS — C44311 Basal cell carcinoma of skin of nose: Secondary | ICD-10-CM | POA: Diagnosis not present

## 2019-01-13 DIAGNOSIS — C4492 Squamous cell carcinoma of skin, unspecified: Secondary | ICD-10-CM

## 2019-01-13 HISTORY — DX: Squamous cell carcinoma of skin, unspecified: C44.92

## 2019-01-18 ENCOUNTER — Encounter (INDEPENDENT_AMBULATORY_CARE_PROVIDER_SITE_OTHER): Payer: Self-pay | Admitting: *Deleted

## 2019-01-26 DIAGNOSIS — E782 Mixed hyperlipidemia: Secondary | ICD-10-CM | POA: Diagnosis not present

## 2019-01-26 DIAGNOSIS — I10 Essential (primary) hypertension: Secondary | ICD-10-CM | POA: Diagnosis not present

## 2019-02-09 DIAGNOSIS — N183 Chronic kidney disease, stage 3 (moderate): Secondary | ICD-10-CM | POA: Diagnosis not present

## 2019-02-26 DIAGNOSIS — I10 Essential (primary) hypertension: Secondary | ICD-10-CM | POA: Diagnosis not present

## 2019-02-26 DIAGNOSIS — E782 Mixed hyperlipidemia: Secondary | ICD-10-CM | POA: Diagnosis not present

## 2019-03-25 DIAGNOSIS — C44311 Basal cell carcinoma of skin of nose: Secondary | ICD-10-CM | POA: Diagnosis not present

## 2019-04-12 DIAGNOSIS — E221 Hyperprolactinemia: Secondary | ICD-10-CM | POA: Diagnosis not present

## 2019-04-12 DIAGNOSIS — N138 Other obstructive and reflux uropathy: Secondary | ICD-10-CM | POA: Diagnosis not present

## 2019-04-12 DIAGNOSIS — N398 Other specified disorders of urinary system: Secondary | ICD-10-CM | POA: Diagnosis not present

## 2019-04-12 DIAGNOSIS — N281 Cyst of kidney, acquired: Secondary | ICD-10-CM | POA: Diagnosis not present

## 2019-04-12 DIAGNOSIS — N200 Calculus of kidney: Secondary | ICD-10-CM | POA: Diagnosis not present

## 2019-04-28 DIAGNOSIS — D0439 Carcinoma in situ of skin of other parts of face: Secondary | ICD-10-CM | POA: Diagnosis not present

## 2019-04-28 DIAGNOSIS — I10 Essential (primary) hypertension: Secondary | ICD-10-CM | POA: Diagnosis not present

## 2019-04-28 DIAGNOSIS — E782 Mixed hyperlipidemia: Secondary | ICD-10-CM | POA: Diagnosis not present

## 2019-05-04 DIAGNOSIS — E1165 Type 2 diabetes mellitus with hyperglycemia: Secondary | ICD-10-CM | POA: Diagnosis not present

## 2019-05-04 DIAGNOSIS — E1122 Type 2 diabetes mellitus with diabetic chronic kidney disease: Secondary | ICD-10-CM | POA: Diagnosis not present

## 2019-05-04 DIAGNOSIS — K219 Gastro-esophageal reflux disease without esophagitis: Secondary | ICD-10-CM | POA: Diagnosis not present

## 2019-05-04 DIAGNOSIS — E782 Mixed hyperlipidemia: Secondary | ICD-10-CM | POA: Diagnosis not present

## 2019-05-06 DIAGNOSIS — I10 Essential (primary) hypertension: Secondary | ICD-10-CM | POA: Diagnosis not present

## 2019-05-06 DIAGNOSIS — T161XXA Foreign body in right ear, initial encounter: Secondary | ICD-10-CM | POA: Diagnosis not present

## 2019-05-06 DIAGNOSIS — N189 Chronic kidney disease, unspecified: Secondary | ICD-10-CM | POA: Diagnosis not present

## 2019-05-06 DIAGNOSIS — Z23 Encounter for immunization: Secondary | ICD-10-CM | POA: Diagnosis not present

## 2019-05-12 ENCOUNTER — Encounter (INDEPENDENT_AMBULATORY_CARE_PROVIDER_SITE_OTHER): Payer: Self-pay | Admitting: Nurse Practitioner

## 2019-05-26 DIAGNOSIS — H524 Presbyopia: Secondary | ICD-10-CM | POA: Diagnosis not present

## 2019-05-26 DIAGNOSIS — H40013 Open angle with borderline findings, low risk, bilateral: Secondary | ICD-10-CM | POA: Diagnosis not present

## 2019-06-28 DIAGNOSIS — I10 Essential (primary) hypertension: Secondary | ICD-10-CM | POA: Diagnosis not present

## 2019-06-28 DIAGNOSIS — E782 Mixed hyperlipidemia: Secondary | ICD-10-CM | POA: Diagnosis not present

## 2019-07-15 DIAGNOSIS — T162XXA Foreign body in left ear, initial encounter: Secondary | ICD-10-CM | POA: Diagnosis not present

## 2019-07-17 DIAGNOSIS — T169XXA Foreign body in ear, unspecified ear, initial encounter: Secondary | ICD-10-CM | POA: Diagnosis not present

## 2019-07-28 ENCOUNTER — Telehealth (INDEPENDENT_AMBULATORY_CARE_PROVIDER_SITE_OTHER): Payer: Self-pay | Admitting: *Deleted

## 2019-07-28 ENCOUNTER — Other Ambulatory Visit (INDEPENDENT_AMBULATORY_CARE_PROVIDER_SITE_OTHER): Payer: Self-pay | Admitting: *Deleted

## 2019-07-28 ENCOUNTER — Encounter (INDEPENDENT_AMBULATORY_CARE_PROVIDER_SITE_OTHER): Payer: Self-pay | Admitting: *Deleted

## 2019-07-28 ENCOUNTER — Ambulatory Visit (INDEPENDENT_AMBULATORY_CARE_PROVIDER_SITE_OTHER): Payer: Medicare Other | Admitting: Nurse Practitioner

## 2019-07-28 ENCOUNTER — Encounter (INDEPENDENT_AMBULATORY_CARE_PROVIDER_SITE_OTHER): Payer: Self-pay | Admitting: Nurse Practitioner

## 2019-07-28 ENCOUNTER — Other Ambulatory Visit: Payer: Self-pay

## 2019-07-28 VITALS — BP 166/95 | HR 73 | Temp 97.7°F | Ht 74.0 in | Wt 211.8 lb

## 2019-07-28 DIAGNOSIS — D132 Benign neoplasm of duodenum: Secondary | ICD-10-CM

## 2019-07-28 DIAGNOSIS — Z86010 Personal history of colonic polyps: Secondary | ICD-10-CM

## 2019-07-28 MED ORDER — SUPREP BOWEL PREP KIT 17.5-3.13-1.6 GM/177ML PO SOLN: 1.0000 | Freq: Once | ORAL | 0 refills | Status: AC

## 2019-07-28 NOTE — Progress Notes (Addendum)
Subjective:    Patient ID: Ronald Valentine, male    DOB: Aug 10, 1940, 78 y.o.   MRN: [id number]  Ronald Valentine is a 78 year old male with a past medical history of hypertension, hyperlipidemia, DM II, CKD stage III, arthritis, kidney stones, BPH, LLE DVT x 3, PE 01/2013 on Eliquis, IVC filter 01/2013, GIB 2010, duodenal adenoma and GERD.  He presents today to schedule a colonoscopy. His most recent colonoscopy was completed 10/17/2011, one tubular adenomatous polyp was removed from the distal transverse colon. He was advised by Dr. Laural Golden to repeat a colonoscopy in 7 years. He denies having any lower abdominal pain. He is passing a normal brown formed bowel movement once daily. No rectal bleeding or melena. No family history of colon cancer. GERD is well controlled on Omeprazole 20mg  once daily. No dysphagia or heartburn. No stomach pain. 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 Recall EGD in 2 years to be done at the time of his colonoscopy. He is on Eliquis due to having 3 LLE DVTs, the most recent DVT was in 2014 which resulted in bilateral PE. He denies being diagnosed with a specific hematological disorder. He has post nasal drainage and phlegm in his throat, scheduled to see Dr. Benjamine Mola ENT in January. No other complaints today.   Labs 05/06/2019: Hg 17.7. HCT 51.8. BUN 17. Cr. 1.34.   EGD 02/20/2017:  - Benign-appearing esophageal stenosis at GEJ(40 cm).  - 2 cm hiatal hernia. - Two non-bleeding tiny angiodysplastic lesions in the stomach. these lesions were not bleeding and were left alone. - Normal duodenal bulb. - Granular mucosa in the second portion of the duodenum. Biopsied. - A single duodenal polyp. Biopsied.  1. Duodenum, NOS biopsy - TUBULAR ADENOMA (2 OF 3 FRAGMENTS) - BENIGN DUODENAL MUCOSA (1 OF 3 FRAGMENTS) - NO HIGH GRADE  DYSPLASIA OR MALIGNANCY IDENTIFIED 2. Duodenum, NOS biopsy - BENIGN DUODENAL MUCOSA (1 OF 1 FRAGMENTS) - NO HIGH GRADE DYSPLASIA OR MALIGNANCY IDENTIFIED -Recall colonoscopy 2 years at time of colonoscopy   10/17/2011: Colonoscopy with snare polypectomy Examination performed to cecum.  Left-sided diverticulosis.  6 mm TA polyp snared from distal transverse colon.  External hemorrhoids  Recall colonoscopy 7 years   Past Medical History:  Diagnosis Date  . Arthritis   . Colon polyp   . GERD (gastroesophageal reflux disease)   . Hypertension   . Kidney stones   . Left leg DVT ([place])   . Pulmonary embolism, bilateral [facility])    Past Surgical History:  Procedure Laterality Date  . BIOPSY  02/20/2017   Procedure: BIOPSY;  Surgeon: Rogene Houston, MD;  Location: [facility];  Service: Endoscopy;;  duodenal  . COLONOSCOPY    . COLONOSCOPY  10/17/2011   Procedure: COLONOSCOPY;  Surgeon: Rogene Houston, MD;  Location: [facility];  Service: Endoscopy;  Laterality: N/A;  930  . ESOPHAGOGASTRODUODENOSCOPY  12/26/2011   Procedure: ESOPHAGOGASTRODUODENOSCOPY (EGD);  Surgeon: Rogene Houston, MD;  Location: [facility];  Service: Endoscopy;  Laterality: N/A;  730  . ESOPHAGOGASTRODUODENOSCOPY N/A 02/20/2017   Procedure: ESOPHAGOGASTRODUODENOSCOPY (EGD);  Surgeon: Rogene Houston, MD;  Location: [facility];  Service: Endoscopy;  Laterality: N/A;  2:20  . LITHOTRIPSY     Current Outpatient Medications on File Prior to Visit  Medication Sig Dispense Refill  .  acetaminophen (TYLENOL) 650 MG CR tablet Take 650 mg by mouth daily.     Marland Kitchen apixaban (ELIQUIS) 5 MG TABS tablet Take 5 mg by mouth 2 (two) times daily.     . cabergoline (DOSTINEX) 0.5 MG tablet TAKE 1 2 (ONE HALF) TABLET BY MOUTH AT BEDTIME TWICE A WEEK TO LOWER PROLACTIN LEVEL    . FIBER PO Take 2 tablets by mouth daily.    Marland Kitchen latanoprost (XALATAN) 0.005 % ophthalmic solution Place 1 drop into the left eye at bedtime.    Marland Kitchen  lisinopril-hydrochlorothiazide (PRINZIDE,ZESTORETIC) 20-12.5 MG per tablet Take 1 tablet by mouth daily.    . mirtazapine (REMERON) 7.5 MG tablet Take 7.5 mg by mouth at bedtime.    . Multiple Vitamins-Minerals (MULTIVITAMIN ADULTS PO) Take 1 tablet by mouth daily.     Marland Kitchen omeprazole (PRILOSEC) 20 MG capsule Take 20 mg by mouth every other day.     . tamsulosin (FLOMAX) 0.4 MG CAPS capsule Take 0.4 mg by mouth at bedtime.     . verapamil (VERELAN PM) 240 MG 24 hr capsule Take 240 mg by mouth daily.      No current facility-administered medications on file prior to visit.  No Known Allergies Family History  Problem Relation Age of Onset  . Hypertension Mother   . Colon cancer Neg Hx    Social History   Socioeconomic History  . Marital status: Married    Spouse name: Not on file  . Number of children: 2  . Years of education: Not on file  . Highest education level: Not on file  Occupational History  . Occupation: Retired-office work     Fish farm manager: RETIRED  Tobacco Use  . Smoking status: Never Smoker  . Smokeless tobacco: Never Used  Substance and Sexual Activity  . Alcohol use: No  . Drug use: No  . Sexual activity: Not on file  Other Topics Concern  . Not on file  Social History Narrative  . Not on file   Social Determinants of Health   Financial Resource Strain:   . Difficulty of Paying Living Expenses: Not on file  Food Insecurity:   . Worried About Charity fundraiser in the Last Year: Not on file  . Ran Out of Food in the Last Year: Not on file  Transportation Needs:   . Lack of Transportation (Medical): Not on file  . Lack of Transportation (Non-Medical): Not on file  Physical Activity:   . Days of Exercise per Week: Not on file  . Minutes of Exercise per Session: Not on file  Stress:   . Feeling of Stress : Not on file  Social Connections:   . Frequency of Communication with Friends and Family: Not on file  . Frequency of Social Gatherings with Friends and  Family: Not on file  . Attends Religious Services: Not on file  . Active Member of Clubs or Organizations: Not on file  . Attends Archivist Meetings: Not on file  . Marital Status: Not on file  Intimate Partner Violence:   . Fear of Current or Ex-Partner: Not on file  . Emotionally Abused: Not on file  . Physically Abused: Not on file  . Sexually Abused: Not on file    Review of Systems: Gen: Denies fever, sweats or chills. No weight loss.  CV: Denies chest pain, palpitations or edema. Resp: Denies cough, shortness of breath of hemoptysis.  GI: Denies heartburn, dysphagia, stomach or lower abdominal pain. No diarrhea  or constipation.  GU : Denies urinary burning, blood in urine, increased urinary frequency or incontinence. MS: Denies joint pain, muscles aches or weakness. Derm: Denies rash, itchiness, skin lesions or unhealing ulcers. Psych: Denies depression, anxiety, memory loss, suicidal ideation and confusion. Heme: Denies bruising, bleeding. Neuro:  Denies headaches, dizziness or paresthesias.    Objective:   Physical Exam BP (!) 166/95 (BP Location: Right Arm, Patient Position: Sitting, Cuff Size: Large)   Pulse 73   Temp 97.7 F (36.5 C) (Oral)   Ht 6\' 2"  (1.88 m)   Wt 211 lb 12.8 oz (96.1 kg)   BMI 27.19 kg/m   General: 78 year old male HOH alert in NAD Eyes: sclera nonicteric, conjunctiva pink Mouth: dentition intact, upper partial plate, no ulcers or lesions  Neck: supple, no lymphadenopathy or thyromegaly Heart: RRR, 2/6 systolic murmur Lungs: breath sounds clear throughout.  Abdomen: soft, nontender, + BS x 4 quads, no masses or HSM Extremities: no edema, LE stasis dermatitis  Neuro: alert and oriented x 4, no focal deficits     Assessment & Plan:   38. 78 year old male with a history of colon polyps -colonoscopy benefits and risks discussed including risk with sedation, risk of bleeding, perforation and infection -our office will contact Dr.  Quillian Quince to verify if ok to hold Eliquis for 2 days prior to EGD/colonoscopy and to verify if Lovenox bridging required while off Eliqui  2. GERD, hx of duodenal adenoma. History of  UGI bleed 2010. -EGD benefits and risks discussed including risk with sedation, risk of bleeding, perforation and infection  3. 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 History of hyperprolactinemia  on Cabergoline, followed by endocrinologist Dr Tresa Endo   Further follow up to be determined after EGD and colonoscopy completed

## 2019-07-28 NOTE — Patient Instructions (Signed)
1. Schedule an EGD and colonoscopy. Our office will contact Dr. Quillian Quince to verify if ok to hold Eliquis for 2 days before your procedures.  2. Further follow up to be determined after EGD and colonoscopy completed

## 2019-07-28 NOTE — Telephone Encounter (Signed)
Patient needs suprep TCS/EGD sch'd 2/10

## 2019-07-29 ENCOUNTER — Other Ambulatory Visit: Payer: Self-pay | Admitting: Dermatology

## 2019-07-29 DIAGNOSIS — D485 Neoplasm of uncertain behavior of skin: Secondary | ICD-10-CM | POA: Diagnosis not present

## 2019-07-29 DIAGNOSIS — I10 Essential (primary) hypertension: Secondary | ICD-10-CM | POA: Diagnosis not present

## 2019-07-29 DIAGNOSIS — E782 Mixed hyperlipidemia: Secondary | ICD-10-CM | POA: Diagnosis not present

## 2019-07-29 DIAGNOSIS — D0439 Carcinoma in situ of skin of other parts of face: Secondary | ICD-10-CM | POA: Diagnosis not present

## 2019-08-03 ENCOUNTER — Telehealth (INDEPENDENT_AMBULATORY_CARE_PROVIDER_SITE_OTHER): Payer: Self-pay | Admitting: *Deleted

## 2019-08-03 NOTE — Telephone Encounter (Signed)
Per Jinny Blossom with Dr Quillian Quince it is ok for patient to hold Eliquis 2 days prior to TCS/EGD sch'd 09/08/19, patient aware

## 2019-08-23 ENCOUNTER — Encounter (INDEPENDENT_AMBULATORY_CARE_PROVIDER_SITE_OTHER): Payer: Self-pay | Admitting: *Deleted

## 2019-09-03 DIAGNOSIS — K219 Gastro-esophageal reflux disease without esophagitis: Secondary | ICD-10-CM | POA: Diagnosis not present

## 2019-09-03 DIAGNOSIS — E1165 Type 2 diabetes mellitus with hyperglycemia: Secondary | ICD-10-CM | POA: Diagnosis not present

## 2019-09-03 DIAGNOSIS — E1122 Type 2 diabetes mellitus with diabetic chronic kidney disease: Secondary | ICD-10-CM | POA: Diagnosis not present

## 2019-09-03 DIAGNOSIS — I2699 Other pulmonary embolism without acute cor pulmonale: Secondary | ICD-10-CM | POA: Diagnosis not present

## 2019-09-03 DIAGNOSIS — I10 Essential (primary) hypertension: Secondary | ICD-10-CM | POA: Diagnosis not present

## 2019-09-06 ENCOUNTER — Other Ambulatory Visit (HOSPITAL_COMMUNITY): Payer: Medicare Other

## 2019-09-06 DIAGNOSIS — D692 Other nonthrombocytopenic purpura: Secondary | ICD-10-CM | POA: Diagnosis not present

## 2019-09-06 DIAGNOSIS — M170 Bilateral primary osteoarthritis of knee: Secondary | ICD-10-CM | POA: Diagnosis not present

## 2019-09-06 DIAGNOSIS — Z86711 Personal history of pulmonary embolism: Secondary | ICD-10-CM | POA: Diagnosis not present

## 2019-09-06 DIAGNOSIS — I10 Essential (primary) hypertension: Secondary | ICD-10-CM | POA: Diagnosis not present

## 2019-09-07 DIAGNOSIS — R0982 Postnasal drip: Secondary | ICD-10-CM | POA: Diagnosis not present

## 2019-09-07 DIAGNOSIS — J342 Deviated nasal septum: Secondary | ICD-10-CM | POA: Diagnosis not present

## 2019-09-07 DIAGNOSIS — J310 Chronic rhinitis: Secondary | ICD-10-CM | POA: Diagnosis not present

## 2019-09-07 DIAGNOSIS — J343 Hypertrophy of nasal turbinates: Secondary | ICD-10-CM | POA: Diagnosis not present

## 2019-09-24 DIAGNOSIS — I10 Essential (primary) hypertension: Secondary | ICD-10-CM | POA: Diagnosis not present

## 2019-09-24 DIAGNOSIS — E7849 Other hyperlipidemia: Secondary | ICD-10-CM | POA: Diagnosis not present

## 2019-10-01 ENCOUNTER — Other Ambulatory Visit (HOSPITAL_COMMUNITY): Payer: Self-pay | Admitting: *Deleted

## 2019-10-04 ENCOUNTER — Other Ambulatory Visit: Payer: Self-pay

## 2019-10-04 ENCOUNTER — Other Ambulatory Visit (HOSPITAL_COMMUNITY)
Admission: RE | Admit: 2019-10-04 | Discharge: 2019-10-04 | Disposition: A | Discharge status: Home / Self Care | Payer: Medicare Other | Source: Ambulatory Visit | Attending: Internal Medicine | Admitting: Internal Medicine

## 2019-10-04 DIAGNOSIS — Z20822 Contact with and (suspected) exposure to covid-19: Secondary | ICD-10-CM | POA: Diagnosis not present

## 2019-10-04 DIAGNOSIS — Z01812 Encounter for preprocedural laboratory examination: Secondary | ICD-10-CM | POA: Diagnosis not present

## 2019-10-05 LAB — SARS CORONAVIRUS 2 (TAT 6-24 HRS): SARS Coronavirus 2: NEGATIVE

## 2019-10-06 ENCOUNTER — Encounter (HOSPITAL_COMMUNITY): Payer: Self-pay | Admitting: Internal Medicine

## 2019-10-06 ENCOUNTER — Encounter (HOSPITAL_COMMUNITY)
Admission: RE | Disposition: A | Discharge status: Home / Self Care | Payer: Self-pay | Source: Home / Self Care | Attending: Internal Medicine

## 2019-10-06 ENCOUNTER — Ambulatory Visit (HOSPITAL_COMMUNITY)
Admission: RE | Admit: 2019-10-06 | Discharge: 2019-10-06 | Disposition: A | Discharge status: Home / Self Care | Payer: Medicare Other | Attending: Internal Medicine | Admitting: Internal Medicine

## 2019-10-06 ENCOUNTER — Other Ambulatory Visit: Payer: Self-pay

## 2019-10-06 ENCOUNTER — Other Ambulatory Visit (INDEPENDENT_AMBULATORY_CARE_PROVIDER_SITE_OTHER): Payer: Self-pay | Admitting: *Deleted

## 2019-10-06 DIAGNOSIS — K5730 Diverticulosis of large intestine without perforation or abscess without bleeding: Secondary | ICD-10-CM

## 2019-10-06 DIAGNOSIS — K449 Diaphragmatic hernia without obstruction or gangrene: Secondary | ICD-10-CM | POA: Insufficient documentation

## 2019-10-06 DIAGNOSIS — Z79899 Other long term (current) drug therapy: Secondary | ICD-10-CM | POA: Insufficient documentation

## 2019-10-06 DIAGNOSIS — I10 Essential (primary) hypertension: Secondary | ICD-10-CM | POA: Insufficient documentation

## 2019-10-06 DIAGNOSIS — D126 Benign neoplasm of colon, unspecified: Secondary | ICD-10-CM | POA: Diagnosis not present

## 2019-10-06 DIAGNOSIS — K2100 Gastro-esophageal reflux disease with esophagitis, without bleeding: Secondary | ICD-10-CM | POA: Insufficient documentation

## 2019-10-06 DIAGNOSIS — Z7901 Long term (current) use of anticoagulants: Secondary | ICD-10-CM | POA: Insufficient documentation

## 2019-10-06 DIAGNOSIS — K269 Duodenal ulcer, unspecified as acute or chronic, without hemorrhage or perforation: Secondary | ICD-10-CM

## 2019-10-06 DIAGNOSIS — D132 Benign neoplasm of duodenum: Secondary | ICD-10-CM | POA: Insufficient documentation

## 2019-10-06 DIAGNOSIS — K2970 Gastritis, unspecified, without bleeding: Secondary | ICD-10-CM

## 2019-10-06 DIAGNOSIS — K644 Residual hemorrhoidal skin tags: Secondary | ICD-10-CM

## 2019-10-06 DIAGNOSIS — D122 Benign neoplasm of ascending colon: Secondary | ICD-10-CM

## 2019-10-06 DIAGNOSIS — Z86010 Personal history of colonic polyps: Secondary | ICD-10-CM

## 2019-10-06 DIAGNOSIS — Z86711 Personal history of pulmonary embolism: Secondary | ICD-10-CM | POA: Diagnosis not present

## 2019-10-06 DIAGNOSIS — K2990 Gastroduodenitis, unspecified, without bleeding: Secondary | ICD-10-CM

## 2019-10-06 DIAGNOSIS — K6289 Other specified diseases of anus and rectum: Secondary | ICD-10-CM | POA: Diagnosis not present

## 2019-10-06 DIAGNOSIS — Z86718 Personal history of other venous thrombosis and embolism: Secondary | ICD-10-CM | POA: Insufficient documentation

## 2019-10-06 DIAGNOSIS — D125 Benign neoplasm of sigmoid colon: Secondary | ICD-10-CM

## 2019-10-06 DIAGNOSIS — Z1211 Encounter for screening for malignant neoplasm of colon: Secondary | ICD-10-CM | POA: Diagnosis not present

## 2019-10-06 DIAGNOSIS — Z13810 Encounter for screening for upper gastrointestinal disorder: Secondary | ICD-10-CM | POA: Insufficient documentation

## 2019-10-06 DIAGNOSIS — Z8719 Personal history of other diseases of the digestive system: Secondary | ICD-10-CM

## 2019-10-06 DIAGNOSIS — Z09 Encounter for follow-up examination after completed treatment for conditions other than malignant neoplasm: Secondary | ICD-10-CM

## 2019-10-06 HISTORY — PX: ESOPHAGOGASTRODUODENOSCOPY: SHX5428

## 2019-10-06 HISTORY — PX: POLYPECTOMY: SHX5525

## 2019-10-06 HISTORY — PX: COLONOSCOPY: SHX5424

## 2019-10-06 SURGERY — EGD (ESOPHAGOGASTRODUODENOSCOPY)
Anesthesia: Moderate Sedation

## 2019-10-06 MED ORDER — MEPERIDINE HCL 50 MG/ML IJ SOLN
INTRAMUSCULAR | Status: DC | PRN
  Administered 2019-10-06 (×2): 25 mg via INTRAVENOUS

## 2019-10-06 MED ORDER — MIDAZOLAM HCL 5 MG/5ML IJ SOLN
INTRAMUSCULAR | Status: AC
  Filled 2019-10-06: qty 10

## 2019-10-06 MED ORDER — LIDOCAINE VISCOUS HCL 2 % MT SOLN
OROMUCOSAL | Status: DC | PRN
  Administered 2019-10-06: 4 mL via OROMUCOSAL

## 2019-10-06 MED ORDER — MIDAZOLAM HCL 5 MG/5ML IJ SOLN
INTRAMUSCULAR | Status: DC | PRN
  Administered 2019-10-06 (×2): 2 mg via INTRAVENOUS
  Administered 2019-10-06: 1 mg via INTRAVENOUS

## 2019-10-06 MED ORDER — STERILE WATER FOR IRRIGATION IR SOLN
Status: DC | PRN
  Administered 2019-10-06: 1.5 mL

## 2019-10-06 MED ORDER — SODIUM CHLORIDE 0.9 % IV SOLN
INTRAVENOUS | Status: DC
  Administered 2019-10-06: 1000 mL via INTRAVENOUS

## 2019-10-06 MED ORDER — LIDOCAINE VISCOUS HCL 2 % MT SOLN
OROMUCOSAL | Status: AC
  Filled 2019-10-06: qty 15

## 2019-10-06 MED ORDER — MEPERIDINE HCL 50 MG/ML IJ SOLN
INTRAMUSCULAR | Status: AC
  Filled 2019-10-06: qty 1

## 2019-10-06 NOTE — Discharge Instructions (Signed)
Resume Eliquis on 10/07/2019 Resume other medications as before. High-fiber diet. No driving for 24 hours. Physician will call with results of biopsy and blood test.   Colonoscopy, Adult, Care After This sheet gives you information about how to care for yourself after your procedure. Your doctor may also give you more specific instructions. If you have problems or questions, call your doctor. What can I expect after the procedure? After the procedure, it is common to have:  A small amount of blood in your poop (stool) for 24 hours.  Some gas.  Mild cramping or bloating in your belly (abdomen). Follow these instructions at home: Eating and drinking   Drink enough fluid to keep your pee (urine) pale yellow.  Follow instructions from your doctor about what you cannot eat or drink.  Return to your normal diet as told by your doctor. Avoid heavy or fried foods that are hard to digest. Activity  Rest as told by your doctor.  Do not sit for a long time without moving. Get up to take short walks every 1-2 hours. This is important. Ask for help if you feel weak or unsteady.  Return to your normal activities as told by your doctor. Ask your doctor what activities are safe for you. To help cramping and bloating:   Try walking around.  Put heat on your belly as told by your doctor. Use the heat source that your doctor recommends, such as a moist heat pack or a heating pad. ? Put a towel between your skin and the heat source. ? Leave the heat on for 20-30 minutes. ? Remove the heat if your skin turns bright red. This is very important if you are unable to feel pain, heat, or cold. You may have a greater risk of getting burned. General instructions  For the first 24 hours after the procedure: ? Do not drive or use machinery. ? Do not sign important documents. ? Do not drink alcohol. ? Do your daily activities more slowly than normal. ? Eat foods that are soft and easy to  digest.  Take over-the-counter or prescription medicines only as told by your doctor.  Keep all follow-up visits as told by your doctor. This is important. Contact a doctor if:  You have blood in your poop 2-3 days after the procedure. Get help right away if:  You have more than a small amount of blood in your poop.  You see large clumps of tissue (blood clots) in your poop.  Your belly is swollen.  You feel like you may vomit (nauseous).  You vomit.  You have a fever.  You have belly pain that gets worse, and medicine does not help your pain. Summary  After the procedure, it is common to have a small amount of blood in your poop. You may also have mild cramping and bloating in your belly.  For the first 24 hours after the procedure, do not drive or use machinery, do not sign important documents, and do not drink alcohol.  Get help right away if you have a lot of blood in your poop, feel like you may vomit, have a fever, or have more belly pain. This information is not intended to replace advice given to you by your health care provider. Make sure you discuss any questions you have with your health care provider. Document Revised: 02/08/2019 Document Reviewed: 02/08/2019 Elsevier Patient Education  [employer].  Colon Polyps  Polyps are tissue growths inside the body. Polyps  can grow in many places, including the large intestine (colon). A polyp may be a round bump or a mushroom-shaped growth. You could have one polyp or several. Most colon polyps are noncancerous (benign). However, some colon polyps can become cancerous over time. Finding and removing the polyps early can help prevent this. What are the causes? The exact cause of colon polyps is not known. What increases the risk? You are more likely to develop this condition if you:  Have a family history of colon cancer or colon polyps.  Are older than 60 or older than 45 if you are African American.  Have  inflammatory bowel disease, such as ulcerative colitis or Crohn's disease.  Have certain hereditary conditions, such as: ? Familial adenomatous polyposis. ? Lynch syndrome. ? Turcot syndrome. ? Peutz-Jeghers syndrome.  Are overweight.  Smoke cigarettes.  Do not get enough exercise.  Drink too much alcohol.  Eat a diet that is high in fat and red meat and low in fiber.  Had childhood cancer that was treated with abdominal radiation. What are the signs or symptoms? Most polyps do not cause symptoms. If you have symptoms, they may include:  Blood coming from your rectum when having a bowel movement.  Blood in your stool. The stool may look dark red or black.  Abdominal pain.  A change in bowel habits, such as constipation or diarrhea. How is this diagnosed? This condition is diagnosed with a colonoscopy. This is a procedure in which a lighted, flexible scope is inserted into the anus and then passed into the colon to examine the area. Polyps are sometimes found when a colonoscopy is done as part of routine cancer screening tests. How is this treated? Treatment for this condition involves removing any polyps that are found. Most polyps can be removed during a colonoscopy. Those polyps will then be tested for cancer. Additional treatment may be needed depending on the results of testing. Follow these instructions at home: Lifestyle  Maintain a healthy weight, or lose weight if recommended by your health care provider.  Exercise every day or as told by your health care provider.  Do not use any products that contain nicotine or tobacco, such as cigarettes and e-cigarettes. If you need help quitting, ask your health care provider.  If you drink alcohol, limit how much you have: ? 0-1 drink a day for women. ? 0-2 drinks a day for men.  Be aware of how much alcohol is in your drink. In the [place] one drink equals one 12 oz bottle of beer (355 mL), one 5 oz glass of wine (148 mL),  or one 1 oz shot of hard liquor (44 mL). Eating and drinking   Eat foods that are high in fiber, such as fruits, vegetables, and whole grains.  Eat foods that are high in calcium and vitamin D, such as milk, cheese, yogurt, eggs, liver, fish, and broccoli.  Limit foods that are high in fat, such as fried foods and desserts.  Limit the amount of red meat and processed meat you eat, such as hot dogs, sausage, bacon, and lunch meats. General instructions  Keep all follow-up visits as told by your health care provider. This is important. ? This includes having regularly scheduled colonoscopies. ? Talk to your health care provider about when you need a colonoscopy. Contact a health care provider if:  You have new or worsening bleeding during a bowel movement.  You have new or increased blood in your stool.  You  have a change in bowel habits.  You lose weight for no known reason. Summary  Polyps are tissue growths inside the body. Polyps can grow in many places, including the colon.  Most colon polyps are noncancerous (benign), but some can become cancerous over time.  This condition is diagnosed with a colonoscopy.  Treatment for this condition involves removing any polyps that are found. Most polyps can be removed during a colonoscopy. This information is not intended to replace advice given to you by your health care provider. Make sure you discuss any questions you have with your health care provider. Document Revised: 10/30/2017 Document Reviewed: 10/30/2017 Elsevier Patient Education  [place], Adult     A hernia happens when tissue inside your body pushes out through a weak spot in your belly muscles (abdominal wall). This makes a round lump (bulge). The lump may be:  In a scar from surgery that was done in your belly (incisional hernia).  Near your belly button (umbilical hernia).  In your groin (inguinal hernia). Your groin is the area where your  leg meets your lower belly (abdomen). This kind of hernia could also be: ? In your scrotum, if you are male. ? In folds of skin around your vagina, if you are male.  In your upper thigh (femoral hernia).  Inside your belly (hiatal hernia). This happens when your stomach slides above the muscle between your belly and your chest (diaphragm). If your hernia is small and it does not cause pain, you may not need treatment. If your hernia is large or it causes pain, you may need surgery. Follow these instructions at home: Activity  Avoid stretching or overusing (straining) the muscles near your hernia. Straining can happen when you: ? Lift something heavy. ? Poop (have a bowel movement).  Do not lift anything that is heavier than 10 lb (4.5 kg), or the limit that you are told, until your doctor says that it is safe.  Use the strength of your legs when you lift something heavy. Do not use only your back muscles to lift. General instructions  Do these things if told by your doctor so you do not have trouble pooping (constipation): ? Drink enough fluid to keep your pee (urine) pale yellow. ? Eat foods that are high in fiber. These include fresh fruits and vegetables, whole grains, and beans. ? Limit foods that are high in fat and processed sugars. These include foods that are fried or sweet. ? Take medicine for trouble pooping.  When you cough, try to cough gently.  You may try to push your hernia in by very gently pressing on it when you are lying down. Do not try to force the bulge back in if it will not push in easily.  If you are overweight, work with your doctor to lose weight safely.  Do not use any products that have nicotine or tobacco in them. These include cigarettes and e-cigarettes. If you need help quitting, ask your doctor.  If you will be having surgery (hernia repair), watch your hernia for changes in shape, size, or color. Tell your doctor if you see any changes.  Take  over-the-counter and prescription medicines only as told by your doctor.  Keep all follow-up visits as told by your doctor. Contact a doctor if:  You get new pain, swelling, or redness near your hernia.  You poop fewer times in a week than normal.  You have trouble pooping.  You have poop (  stool) that is more dry than normal.  You have poop that is harder or larger than normal. Get help right away if:  You have a fever.  You have belly pain that gets worse.  You feel sick to your stomach (nauseous).  You throw up (vomit).  Your hernia cannot be pushed in by very gently pressing on it when you are lying down. Do not try to force the bulge back in if it will not push in easily.  Your hernia: ? Changes in shape or size. ? Changes color. ? Feels hard or it hurts when you touch it. These symptoms may represent a serious problem that is an emergency. Do not wait to see if the symptoms will go away. Get medical help right away. Call your local emergency services (911 in the [place] Summary  A hernia happens when tissue inside your body pushes out through a weak spot in the belly muscles. This creates a bulge.  If your hernia is small and it does not hurt, you may not need treatment. If your hernia is large or it hurts, you may need surgery.  If you will be having surgery, watch your hernia for changes in shape, size, or color. Tell your doctor about any changes. This information is not intended to replace advice given to you by your health care provider. Make sure you discuss any questions you have with your health care provider. Document Revised: 11/05/2018 Document Reviewed: 04/16/2017 Elsevier Patient Education  [place].  Hemorrhoids Hemorrhoids are swollen veins that may develop:  In the butt (rectum). These are called internal hemorrhoids.  Around the opening of the butt (anus). These are called external hemorrhoids. Hemorrhoids can cause pain, itching, or  bleeding. Most of the time, they do not cause serious problems. They usually get better with diet changes, lifestyle changes, and other home treatments. What are the causes? This condition may be caused by:  Having trouble pooping (constipation).  Pushing hard (straining) to poop.  Watery poop (diarrhea).  Pregnancy.  Being very overweight (obese).  Sitting for long periods of time.  Heavy lifting or other activity that causes you to strain.  Anal sex.  Riding a bike for a long period of time. What are the signs or symptoms? Symptoms of this condition include:  Pain.  Itching or soreness in the butt.  Bleeding from the butt.  Leaking poop.  Swelling in the area.  One or more lumps around the opening of your butt. How is this diagnosed? A doctor can often diagnose this condition by looking at the affected area. The doctor may also:  Do an exam that involves feeling the area with a gloved hand (digital rectal exam).  Examine the area inside your butt using a small tube (anoscope).  Order blood tests. This may be done if you have lost a lot of blood.  Have you get a test that involves looking inside the colon using a flexible tube with a camera on the end (sigmoidoscopy or colonoscopy). How is this treated? This condition can usually be treated at home. Your doctor may tell you to change what you eat, make lifestyle changes, or try home treatments. If these do not help, procedures can be done to remove the hemorrhoids or make them smaller. These may involve:  Placing rubber bands at the base of the hemorrhoids to cut off their blood supply.  Injecting medicine into the hemorrhoids to shrink them.  Shining a type of light energy onto  the hemorrhoids to cause them to fall off.  Doing surgery to remove the hemorrhoids or cut off their blood supply. Follow these instructions at home: Eating and drinking   Eat foods that have a lot of fiber in them. These include  whole grains, beans, nuts, fruits, and vegetables.  Ask your doctor about taking products that have added fiber (fibersupplements).  Reduce the amount of fat in your diet. You can do this by: ? Eating low-fat dairy products. ? Eating less red meat. ? Avoiding processed foods.  Drink enough fluid to keep your pee (urine) pale yellow. Managing pain and swelling   Take a warm-water bath (sitz bath) for 20 minutes to ease pain. Do this 3-4 times a day. You may do this in a bathtub or using a portable sitz bath that fits over the toilet.  If told, put ice on the painful area. It may be helpful to use ice between your warm baths. ? Put ice in a plastic bag. ? Place a towel between your skin and the bag. ? Leave the ice on for 20 minutes, 2-3 times a day. General instructions  Take over-the-counter and prescription medicines only as told by your doctor. ? Medicated creams and medicines may be used as told.  Exercise often. Ask your doctor how much and what kind of exercise is best for you.  Go to the bathroom when you have the urge to poop. Do not wait.  Avoid pushing too hard when you poop.  Keep your butt dry and clean. Use wet toilet paper or moist towelettes after pooping.  Do not sit on the toilet for a long time.  Keep all follow-up visits as told by your doctor. This is important. Contact a doctor if you:  Have pain and swelling that do not get better with treatment or medicine.  Have trouble pooping.  Cannot poop.  Have pain or swelling outside the area of the hemorrhoids. Get help right away if you have:  Bleeding that will not stop. Summary  Hemorrhoids are swollen veins in the butt or around the opening of the butt.  They can cause pain, itching, or bleeding.  Eat foods that have a lot of fiber in them. These include whole grains, beans, nuts, fruits, and vegetables.  Take a warm-water bath (sitz bath) for 20 minutes to ease pain. Do this 3-4 times a  day. This information is not intended to replace advice given to you by your health care provider. Make sure you discuss any questions you have with your health care provider. Document Revised: 07/23/2018 Document Reviewed: 12/04/2017 Elsevier Patient Education  [place].  Diverticulosis  Diverticulosis is a condition that develops when small pouches (diverticula) form in the wall of the large intestine (colon). The colon is where water is absorbed and stool (feces) is formed. The pouches form when the inside layer of the colon pushes through weak spots in the outer layers of the colon. You may have a few pouches or many of them. The pouches usually do not cause problems unless they become inflamed or infected. When this happens, the condition is called diverticulitis. What are the causes? The cause of this condition is not known. What increases the risk? The following factors may make you more likely to develop this condition:  Being older than age 12. Your risk for this condition increases with age. Diverticulosis is rare among people younger than age 5. By age 81, many people have it.  Eating a  low-fiber diet.  Having frequent constipation.  Being overweight.  Not getting enough exercise.  Smoking.  Taking over-the-counter pain medicines, like aspirin and ibuprofen.  Having a family history of diverticulosis. What are the signs or symptoms? In most people, there are no symptoms of this condition. If you do have symptoms, they may include:  Bloating.  Cramps in the abdomen.  Constipation or diarrhea.  Pain in the lower left side of the abdomen. How is this diagnosed? Because diverticulosis usually has no symptoms, it is most often diagnosed during an exam for other colon problems. The condition may be diagnosed by:  Using a flexible scope to examine the colon (colonoscopy).  Taking an X-ray of the colon after dye has been put into the colon (barium  enema).  Having a CT scan. How is this treated? You may not need treatment for this condition. Your health care provider may recommend treatment to prevent problems. You may need treatment if you have symptoms or if you previously had diverticulitis. Treatment may include:  Eating a high-fiber diet.  Taking a fiber supplement.  Taking a live bacteria supplement (probiotic).  Taking medicine to relax your colon. Follow these instructions at home: Medicines  Take over-the-counter and prescription medicines only as told by your health care provider.  If told by your health care provider, take a fiber supplement or probiotic. Constipation prevention Your condition may cause constipation. To prevent or treat constipation, you may need to:  Drink enough fluid to keep your urine pale yellow.  Take over-the-counter or prescription medicines.  Eat foods that are high in fiber, such as beans, whole grains, and fresh fruits and vegetables.  Limit foods that are high in fat and processed sugars, such as fried or sweet foods.  General instructions  Try not to strain when you have a bowel movement.  Keep all follow-up visits as told by your health care provider. This is important. Contact a health care provider if you:  Have pain in your abdomen.  Have bloating.  Have cramps.  Have not had a bowel movement in 3 days. Get help right away if:  Your pain gets worse.  Your bloating becomes very bad.  You have a fever or chills, and your symptoms suddenly get worse.  You vomit.  You have bowel movements that are bloody or black.  You have bleeding from your rectum. Summary  Diverticulosis is a condition that develops when small pouches (diverticula) form in the wall of the large intestine (colon).  You may have a few pouches or many of them.  This condition is most often diagnosed during an exam for other colon problems.  Treatment may include increasing the fiber in  your diet, taking supplements, or taking medicines. This information is not intended to replace advice given to you by your health care provider. Make sure you discuss any questions you have with your health care provider. Document Revised: 02/11/2019 Document Reviewed: 02/11/2019 Elsevier Patient Education  [place].   High-Fiber Diet Fiber, also called dietary fiber, is a type of carbohydrate that is found in fruits, vegetables, whole grains, and beans. A high-fiber diet can have many health benefits. Your health care provider may recommend a high-fiber diet to help:  Prevent constipation. Fiber can make your bowel movements more regular.  Lower your cholesterol.  Relieve the following conditions: ? Swelling of veins in the anus (hemorrhoids). ? Swelling and irritation (inflammation) of specific areas of the digestive tract (uncomplicated diverticulosis). ? A  problem of the large intestine (colon) that sometimes causes pain and diarrhea (irritable bowel syndrome, IBS).  Prevent overeating as part of a weight-loss plan.  Prevent heart disease, type 2 diabetes, and certain cancers. What is my plan? The recommended daily fiber intake in grams (g) includes:  38 g for men age 104 or younger.  30 g for men over age 67.  92 g for women age 52 or younger.  21 g for women over age 1. You can get the recommended daily intake of dietary fiber by:  Eating a variety of fruits, vegetables, grains, and beans.  Taking a fiber supplement, if it is not possible to get enough fiber through your diet. What do I need to know about a high-fiber diet?  It is better to get fiber through food sources rather than from fiber supplements. There is not a lot of research about how effective supplements are.  Always check the fiber content on the nutrition facts label of any prepackaged food. Look for foods that contain 5 g of fiber or more per serving.  Talk with a diet and nutrition  specialist (dietitian) if you have questions about specific foods that are recommended or not recommended for your medical condition, especially if those foods are not listed below.  Gradually increase how much fiber you consume. If you increase your intake of dietary fiber too quickly, you may have bloating, cramping, or gas.  Drink plenty of water. Water helps you to digest fiber. What are tips for following this plan?  Eat a wide variety of high-fiber foods.  Make sure that half of the grains that you eat each day are whole grains.  Eat breads and cereals that are made with whole-grain flour instead of refined flour or white flour.  Eat brown rice, bulgur wheat, or millet instead of white rice.  Start the day with a breakfast that is high in fiber, such as a cereal that contains 5 g of fiber or more per serving.  Use beans in place of meat in soups, salads, and pasta dishes.  Eat high-fiber snacks, such as berries, raw vegetables, nuts, and popcorn.  Choose whole fruits and vegetables instead of processed forms like juice or sauce. What foods can I eat?  Fruits Berries. Pears. Apples. Oranges. Avocado. Prunes and raisins. Dried figs. Vegetables Sweet potatoes. Spinach. Kale. Artichokes. Cabbage. Broccoli. Cauliflower. Green peas. Carrots. Squash. Grains Whole-grain breads. Multigrain cereal. Oats and oatmeal. Brown rice. Barley. Bulgur wheat. [place]. Quinoa. Bran muffins. Popcorn. Rye wafer crackers. Meats and other proteins Navy, kidney, and pinto beans. Soybeans. Split peas. Lentils. Nuts and seeds. Dairy Fiber-fortified yogurt. Beverages Fiber-fortified soy milk. Fiber-fortified orange juice. Other foods Fiber bars. The items listed above may not be a complete list of recommended foods and beverages. Contact a dietitian for more options. What foods are not recommended? Fruits Fruit juice. Cooked, strained fruit. Vegetables Fried potatoes. Canned vegetables.  Well-cooked vegetables. Grains White bread. Pasta made with refined flour. White rice. Meats and other proteins Fatty cuts of meat. Fried chicken or fried fish. Dairy Milk. Yogurt. Cream cheese. Sour cream. Fats and oils Butters. Beverages Soft drinks. Other foods Cakes and pastries. The items listed above may not be a complete list of foods and beverages to avoid. Contact a dietitian for more information. Summary  Fiber is a type of carbohydrate. It is found in fruits, vegetables, whole grains, and beans.  There are many health benefits of eating a high-fiber diet, such as preventing constipation,  lowering blood cholesterol, helping with weight loss, and reducing your risk of heart disease, diabetes, and certain cancers.  Gradually increase your intake of fiber. Increasing too fast can result in cramping, bloating, and gas. Drink plenty of water while you increase your fiber.  The best sources of fiber include whole fruits and vegetables, whole grains, nuts, seeds, and beans. This information is not intended to replace advice given to you by your health care provider. Make sure you discuss any questions you have with your health care provider. Document Revised: 05/19/2017 Document Reviewed: 05/19/2017 [employer]vier Patient Education  2020 [employer].

## 2019-10-06 NOTE — H&P (Signed)
Ronald Valentine is an 79 y.o. male.   Chief Complaint: Patient is here for esophagogastroduodenoscopy and colonoscopy. HPI: Patient is 79 year old Caucasian male who has a history of duodenal adenomas and colonic adenoma was here for surveillance EGD and colonoscopy.  He denies abdominal pain melena or rectal bleeding.  He has chronic GERD.  His esophagus has been dilated in the past.  He denies dysphagia.  He feels omeprazole is working well. Eliquis is on hold.  Last dose was 3 days ago. Family history is negative for CRC.  Past Medical History:  Diagnosis Date  . Arthritis   . Colon polyp   . GERD (gastroesophageal reflux disease)   . Hypertension   . Kidney stones   . Left leg DVT (Belt)   . Pulmonary embolism, bilateral [facility])     Past Surgical History:  Procedure Laterality Date  . BIOPSY  02/20/2017   Procedure: BIOPSY;  Surgeon: Rogene Houston, MD;  Location: [facility];  Service: Endoscopy;;  duodenal  . COLONOSCOPY    . COLONOSCOPY  10/17/2011   Procedure: COLONOSCOPY;  Surgeon: Rogene Houston, MD;  Location: [facility];  Service: Endoscopy;  Laterality: N/A;  930  . ESOPHAGOGASTRODUODENOSCOPY  12/26/2011   Procedure: ESOPHAGOGASTRODUODENOSCOPY (EGD);  Surgeon: Rogene Houston, MD;  Location: [facility];  Service: Endoscopy;  Laterality: N/A;  730  . ESOPHAGOGASTRODUODENOSCOPY N/A 02/20/2017   Procedure: ESOPHAGOGASTRODUODENOSCOPY (EGD);  Surgeon: Rogene Houston, MD;  Location: [facility];  Service: Endoscopy;  Laterality: N/A;  2:20  . LITHOTRIPSY      Family History  Problem Relation Age of Onset  . Hypertension Mother   . Colon cancer Neg Hx    Social History:  reports that he has never smoked. He has never used smokeless tobacco. He reports that he does not drink alcohol or use drugs.  Allergies: No Known Allergies  Medications Prior to Admission  Medication Sig Dispense Refill  . acetaminophen (TYLENOL) 650 MG CR tablet Take 650 mg by mouth daily.      Marland Kitchen apixaban (ELIQUIS) 5 MG TABS tablet Take 5 mg by mouth 2 (two) times daily.     . cabergoline (DOSTINEX) 0.5 MG tablet Take 0.25 mg by mouth 2 (two) times a week. Bedtime  Tuesday and friday    . FIBER PO Take 2 tablets by mouth daily.    Marland Kitchen latanoprost (XALATAN) 0.005 % ophthalmic solution Place 1 drop into the left eye at bedtime.    Marland Kitchen lisinopril-hydrochlorothiazide (PRINZIDE,ZESTORETIC) 20-12.5 MG per tablet Take 1 tablet by mouth daily.    . mirtazapine (REMERON) 7.5 MG tablet Take 7.5 mg by mouth at bedtime.    . Multiple Vitamins-Minerals (MULTIVITAMIN ADULTS PO) Take 1 tablet by mouth daily.     Marland Kitchen omeprazole (PRILOSEC) 20 MG capsule Take 20 mg by mouth every other day.     . tamsulosin (FLOMAX) 0.4 MG CAPS capsule Take 0.4 mg by mouth at bedtime.     . verapamil (VERELAN PM) 240 MG 24 hr capsule Take 240 mg by mouth daily.       No results found for this or any previous visit (from the past 48 hour(s)). No results found.  Review of Systems  Blood pressure (!) 142/75, pulse 98, temperature 98.4 F (36.9 C), temperature source Oral, resp. rate 14, height 6\' 2"  (1.88 m), weight 94.3 kg, SpO2 96 %. Physical Exam  Constitutional: He appears well-developed and well-nourished.  HENT:  Mouth/Throat: Oropharynx is  clear and moist.  Eyes: Conjunctivae are normal. No scleral icterus.  Neck: No thyromegaly present.  Cardiovascular: Normal rate and regular rhythm.  Murmur heard. Grade 2/6 systolic murmur best heard at aortic area.  Respiratory: Effort normal and breath sounds normal.  GI: Soft. He exhibits no distension and no mass. There is no abdominal tenderness.  Musculoskeletal:        General: No edema.  Lymphadenopathy:    He has no cervical adenopathy.  Neurological: He is alert.  Skin: Skin is warm and dry.     Assessment/Plan History of duodenal and colonic adenomas. Surveillance EGD and colonoscopy.  Hildred Laser, MD 10/06/2019, 12:07 PM

## 2019-10-06 NOTE — Op Note (Signed)
[facility] Patient Name: Ronald Valentine Procedure Date: 10/06/2019 12:29 PM MRN: [id number] Date of Birth: [birth year] Attending MD: Hildred Laser , MD CSN: [id number] Age: 79 Admit Type: Outpatient Procedure:                Colonoscopy Indications:              High risk colon cancer surveillance: Personal                            history of colonic polyps Providers:                Hildred Laser, MD, Gwenlyn Fudge RN, RN, Raphael Gibney, Technician Referring MD:             Mitzie Na. Quillian Quince , MD Medicines:                None Complications:            No immediate complications. Estimated Blood Loss:     Estimated blood loss was minimal. Procedure:                Pre-Anesthesia Assessment:                           - Prior to the procedure, a History and Physical                            was performed, and patient medications and                            allergies were reviewed. The patient's tolerance of                            previous anesthesia was also reviewed. The risks                            and benefits of the procedure and the sedation                            options and risks were discussed with the patient.                            All questions were answered, and informed consent                            was obtained. Prior Anticoagulants: The patient                            last took Eliquis (apixaban) 3 days prior to the                            procedure. ASA Grade Assessment: III - A patient  with severe systemic disease. After reviewing the                            risks and benefits, the patient was deemed in                            satisfactory condition to undergo the procedure.                           - Prior to the procedure, a History and Physical                            was performed, and patient medications and                            allergies were reviewed. The  patient's tolerance of                            previous anesthesia was also reviewed. The risks                            and benefits of the procedure and the sedation                            options and risks were discussed with the patient.                            All questions were answered, and informed consent                            was obtained. ASA Grade Assessment: III - A patient                            with severe systemic disease. After reviewing the                            risks and benefits, the patient was deemed in                            satisfactory condition to undergo the procedure.                           After obtaining informed consent, the colonoscope                            was passed under direct vision. Throughout the                            procedure, the patient's blood pressure, pulse, and                            oxygen saturations were monitored continuously. The  [id number]-[id number] scope was introduced through                            the anus and advanced to the the cecum, identified                            by appendiceal orifice and ileocecal valve. The                            colonoscopy was performed without difficulty. The                            patient tolerated the procedure well. The quality                            of the bowel preparation was adequate. The                            ileocecal valve, appendiceal orifice, and rectum                            were photographed. Scope In: 12:31:50 PM Scope Out: [id number] PM Scope Withdrawal Time: 0 hours 17 minutes 34 seconds  Total Procedure Duration: 0 hours 25 minutes 8 seconds  Findings:      The perianal and digital rectal examinations were normal.      A small polyp was found in the ascending colon. Biopsies were taken with       a cold forceps for histology. The pathology specimen was placed into       Bottle Number  1.      A small polyp was found in the sigmoid colon. The polyp was sessile. The       polyp was removed with a cold snare. Resection and retrieval were       complete. The pathology specimen was placed into Bottle Number 1.      Multiple small and large-mouthed diverticula were found in the sigmoid       colon.      External hemorrhoids were found during retroflexion. The hemorrhoids       were small.      A scar was found at the dentate line. Impression:               - One small polyp in the ascending colon. Biopsied.                           - One small polyp in the sigmoid colon, removed                            with a cold snare. Resected and retrieved.                           - Diverticulosis in the sigmoid colon.                           - External hemorrhoids.                           -  Scar at the dentate line. Moderate Sedation:      Moderate (conscious) sedation was administered by the endoscopy nurse       and supervised by the endoscopist. The following parameters were       monitored: oxygen saturation, heart rate, blood pressure, CO2       capnography and response to care. Total physician intraservice time was       31 minutes. Recommendation:           - Patient has a contact number available for                            emergencies. The signs and symptoms of potential                            delayed complications were discussed with the                            patient. Return to normal activities tomorrow.                            Written discharge instructions were provided to the                            patient.                           - High fiber diet today.                           - Continue present medications.                           - Resume Eliquis (apixaban) at prior dose tomorrow.                           - Await pathology results.                           - Repeat colonoscopy is recommended. The                             colonoscopy date will be determined after pathology                            results from today's exam become available for                            review. Procedure Code(s):        --- Professional ---                           [phone], Colonoscopy, flexible; with removal of                            tumor(s), polyp(s), or other lesion(s) by snare  technique                           [id number], 59, Colonoscopy, flexible; with biopsy,                            single or multiple                           99153, Moderate sedation; each additional 15                            minutes intraservice time                           G0500, Moderate sedation services provided by the                            same physician or other qualified health care                            professional performing a gastrointestinal                            endoscopic service that sedation supports,                            requiring the presence of an independent trained                            observer to assist in the monitoring of the                            patient's level of consciousness and physiological                            status; initial 15 minutes of intra-service time;                            patient age 63 years or older (additional time may                            be reported with [phone], as appropriate) Diagnosis Code(s):        --- Professional ---                           Z86.010, Personal history of colonic polyps                           K63.5, Polyp of colon                           K64.4, Residual hemorrhoidal skin tags                           K62.89, Other specified diseases of anus and rectum  K57.30, Diverticulosis of large intestine without                            perforation or abscess without bleeding CPT copyright 2019 American Medical Association. All rights reserved. The codes documented in this  report are preliminary and upon coder review may  be revised to meet current compliance requirements. Hildred Laser, MD Hildred Laser, MD 10/06/2019 1:17:32 PM This report has been signed electronically. Number of Addenda: 0

## 2019-10-06 NOTE — Op Note (Signed)
[facility] Patient Name: Ronald Valentine Procedure Date: 10/06/2019 10:59 AM MRN: [id number] Date of Birth: July 05, 1941 Attending MD: Hildred Laser , MD CSN: [id number] Age: 79 Admit Type: Outpatient Procedure:                Upper GI endoscopy Indications:              Surveillance procedure Providers:                Hildred Laser, MD, Jeanann Lewandowsky. Sharon Seller, RN, Raphael Gibney, Technician Referring MD:             Mitzie Na. Quillian Quince, MD Medicines:                Lidocaine spray, Meperidine 50 mg IV, Midazolam 5                            mg IV Complications:            No immediate complications. Estimated Blood Loss:     Estimated blood loss: none. Procedure:                Pre-Anesthesia Assessment:                           - Prior to the procedure, a History and Physical                            was performed, and patient medications and                            allergies were reviewed. The patient's tolerance of                            previous anesthesia was also reviewed. The risks                            and benefits of the procedure and the sedation                            options and risks were discussed with the patient.                            All questions were answered, and informed consent                            was obtained. Prior Anticoagulants: The patient                            last took Eliquis (apixaban) 3 days prior to the                            procedure. ASA Grade Assessment: III - A patient  with severe systemic disease. After reviewing the                            risks and benefits, the patient was deemed in                            satisfactory condition to undergo the procedure.                           After obtaining informed consent, the endoscope was                            passed under direct vision. Throughout the                            procedure, the patient's  blood pressure, pulse, and                            oxygen saturations were monitored continuously. The                            GIF-H190 [id number]) scope was introduced through the                            mouth, and advanced to the second part of duodenum.                            The upper GI endoscopy was accomplished without                            difficulty. The patient tolerated the procedure                            well. Scope In: 12:18:31 PM Scope Out: 12:25:54 PM Total Procedure Duration: 0 hours 7 minutes 23 seconds  Findings:      The hypopharynx was normal.      The examined esophagus was normal.      Esophagitis was found 40 cm from the incisors(GEJ)      A 2 cm hiatal hernia was present.      Patchy mild inflammation characterized by congestion (edema), erosions       and erythema was found in the gastric antrum. Few pills at gastric body.      The exam of the stomach was otherwise normal.      A few erosions without bleeding were found in the duodenal bulb.      The second portion of the duodenum and major papilla were normal. Impression:               - Normal hypopharynx.                           - Normal esophagus.                           - Mild esophagitis at GEJ                           -  2 cm hiatal hernia.                           - Gastritis with few pills at gastric body.                           - Duodenal erosions without bleeding.                           - Normal second portion of the duodenum and major                            papilla.                           - No specimens collected. Moderate Sedation:      Moderate (conscious) sedation was administered by the endoscopy nurse       and supervised by the endoscopist. The following parameters were       monitored: oxygen saturation, heart rate, blood pressure, CO2       capnography and response to care. Total physician intraservice time was       13 minutes. Recommendation:            - Patient has a contact number available for                            emergencies. The signs and symptoms of potential                            delayed complications were discussed with the                            patient. Return to normal activities tomorrow.                            Written discharge instructions were provided to the                            patient.                           - Resume previous diet today.                           - Continue present medications.                           - H.Pylori serolgy.                           - See the other procedure note for documentation of                            additional recommendations. Procedure Code(s):        --- Professional ---  [id number], Esophagogastroduodenoscopy, flexible,                            transoral; diagnostic, including collection of                            specimen(s) by brushing or washing, when performed                            (separate procedure)                           G0500, Moderate sedation services provided by the                            same physician or other qualified health care                            professional performing a gastrointestinal                            endoscopic service that sedation supports,                            requiring the presence of an independent trained                            observer to assist in the monitoring of the                            patient's level of consciousness and physiological                            status; initial 15 minutes of intra-service time;                            patient age 29 years or older (additional time may                            be reported with [phone], as appropriate) Diagnosis Code(s):        --- Professional ---                           K20.90, Esophagitis, unspecified without bleeding                           K44.9, Diaphragmatic hernia without  obstruction or                            gangrene                           K29.70, Gastritis, unspecified, without bleeding                           K26.9, Duodenal ulcer, unspecified as acute  or                            chronic, without hemorrhage or perforation CPT copyright 2019 American Medical Association. All rights reserved. The codes documented in this report are preliminary and upon coder review may  be revised to meet current compliance requirements. Hildred Laser, MD Hildred Laser, MD 10/06/2019 1:08:21 PM This report has been signed electronically. Number of Addenda: 0

## 2019-10-07 LAB — H. PYLORI ANTIBODY, IGG: H Pylori IgG: 0.34 Index Value (ref 0.00–0.79)

## 2019-10-07 LAB — SURGICAL PATHOLOGY

## 2019-10-29 DIAGNOSIS — E221 Hyperprolactinemia: Secondary | ICD-10-CM | POA: Diagnosis not present

## 2019-11-01 DIAGNOSIS — N200 Calculus of kidney: Secondary | ICD-10-CM | POA: Diagnosis not present

## 2019-11-01 DIAGNOSIS — N398 Other specified disorders of urinary system: Secondary | ICD-10-CM | POA: Diagnosis not present

## 2019-11-01 DIAGNOSIS — E221 Hyperprolactinemia: Secondary | ICD-10-CM | POA: Diagnosis not present

## 2019-11-01 DIAGNOSIS — N281 Cyst of kidney, acquired: Secondary | ICD-10-CM | POA: Diagnosis not present

## 2019-11-09 DIAGNOSIS — H6121 Impacted cerumen, right ear: Secondary | ICD-10-CM | POA: Diagnosis not present

## 2019-11-09 DIAGNOSIS — J343 Hypertrophy of nasal turbinates: Secondary | ICD-10-CM | POA: Diagnosis not present

## 2019-11-09 DIAGNOSIS — J310 Chronic rhinitis: Secondary | ICD-10-CM | POA: Diagnosis not present

## 2019-11-09 DIAGNOSIS — J342 Deviated nasal septum: Secondary | ICD-10-CM | POA: Diagnosis not present

## 2019-11-25 DIAGNOSIS — H40023 Open angle with borderline findings, high risk, bilateral: Secondary | ICD-10-CM | POA: Diagnosis not present

## 2019-11-25 DIAGNOSIS — H25813 Combined forms of age-related cataract, bilateral: Secondary | ICD-10-CM | POA: Diagnosis not present

## 2019-12-24 DIAGNOSIS — H1132 Conjunctival hemorrhage, left eye: Secondary | ICD-10-CM | POA: Diagnosis not present

## 2020-01-10 ENCOUNTER — Other Ambulatory Visit: Payer: Self-pay

## 2020-01-10 ENCOUNTER — Ambulatory Visit: Payer: Medicare Other | Admitting: Dermatology

## 2020-01-10 DIAGNOSIS — I10 Essential (primary) hypertension: Secondary | ICD-10-CM | POA: Diagnosis not present

## 2020-01-10 DIAGNOSIS — Z1283 Encounter for screening for malignant neoplasm of skin: Secondary | ICD-10-CM | POA: Diagnosis not present

## 2020-01-10 DIAGNOSIS — K2100 Gastro-esophageal reflux disease with esophagitis, without bleeding: Secondary | ICD-10-CM | POA: Diagnosis not present

## 2020-01-10 DIAGNOSIS — E1122 Type 2 diabetes mellitus with diabetic chronic kidney disease: Secondary | ICD-10-CM | POA: Diagnosis not present

## 2020-01-10 DIAGNOSIS — L570 Actinic keratosis: Secondary | ICD-10-CM | POA: Diagnosis not present

## 2020-01-10 DIAGNOSIS — R946 Abnormal results of thyroid function studies: Secondary | ICD-10-CM | POA: Diagnosis not present

## 2020-01-10 DIAGNOSIS — L821 Other seborrheic keratosis: Secondary | ICD-10-CM | POA: Diagnosis not present

## 2020-01-10 DIAGNOSIS — E782 Mixed hyperlipidemia: Secondary | ICD-10-CM | POA: Diagnosis not present

## 2020-01-10 DIAGNOSIS — N183 Chronic kidney disease, stage 3 unspecified: Secondary | ICD-10-CM | POA: Diagnosis not present

## 2020-01-10 NOTE — Progress Notes (Signed)
DR Denna Haggard LN2 X2 ABOVE LEFT BROW

## 2020-01-10 NOTE — Progress Notes (Signed)
   Follow-Up Visit   Subjective  Ronald Valentine is a 79 y.o. male who presents for the following: Annual Exam (Concerns face and left leg. Got a knot in the left middle finger.).  Growth Location: Face left leg Duration: Years Quality: Able Associated Signs/Symptoms: Modifying Factors:  Severity:  Timing: Context: History of nonmelanoma skin cancers  Objective  Well appearing patient in no apparent distress; mood and affect are within normal limits.  A full examination was performed including scalp, head, eyes, ears, nose, lips, neck, chest, axillae, abdomen, back, buttocks, bilateral upper extremities, bilateral lower extremities, hands, feet, fingers, toes, fingernails, and toenails. All findings within normal limits unless otherwise noted below.   Assessment & Plan   Patient Instructions  Routine follow-up for Elex Ronald Valentine date of birth 03/09/1941.  There is no sign of recurrence of any of his nonmole skin cancers and no atypical mole from legs to scalp.  He has multiple benign keratoses on each thigh and many including some larger ones on his back and multiple small ones on his forehead.  These are not moles and do not require removal.  Two pink crusts above the left eyebrow are precancers and were treated with 5 seconds of liquid nitrogen; these will likely swell and may peel in the next 2 weeks.  There is no special care for the spots.  He also has 1 little crust to the left of the lower nostril which I suspect is an inflammatory bump related to the Covid mask and it will go away on its own.  If this bump does not go away in 3 months, he will return at his wife's scheduled follow-up and we may take a biopsy.   Seborrheic keratosis (3) Head - Anterior (Face) (2); Right Thigh - Anterior  Leave if stable  AK (actinic keratosis) (2) Left Eyebrow  Destruction of lesion - Left Eyebrow Complexity: simple   Destruction method: cryotherapy   Informed consent: discussed and  consent obtained   Timeout:  patient name, date of birth, surgical site, and procedure verified Lesion destroyed using liquid nitrogen: Yes   Region frozen until ice ball extended beyond lesion: Yes   Cryotherapy cycles:  5 Outcome: patient tolerated procedure well with no complications    Small cyst left long finger mention in skin exam.  Will refer to hand surgeon if this becomes bothersome. Skin cancer screening performed today.   I, Lavonna Monarch, MD, have reviewed all documentation for this visit.  The documentation on 02/07/20 for the exam, diagnosis, procedures, and orders are all accurate and complete.dz

## 2020-01-10 NOTE — Patient Instructions (Addendum)
Routine follow-up for Ronald Valentine date of birth 11-Oct-1940.  There is no sign of recurrence of any of his nonmole skin cancers and no atypical mole from legs to scalp.  He has multiple benign keratoses on each thigh and many including some larger ones on his back and multiple small ones on his forehead.  These are not moles and do not require removal.  Two pink crusts above the left eyebrow are precancers and were treated with 5 seconds of liquid nitrogen; these will likely swell and may peel in the next 2 weeks.  There is no special care for the spots.  He also has 1 little crust to the left of the lower nostril which I suspect is an inflammatory bump related to the Covid mask and it will go away on its own.  If this bump does not go away in 3 months, he will return at his wife's scheduled follow-up and we may take a biopsy.

## 2020-01-13 DIAGNOSIS — E782 Mixed hyperlipidemia: Secondary | ICD-10-CM | POA: Diagnosis not present

## 2020-01-13 DIAGNOSIS — I10 Essential (primary) hypertension: Secondary | ICD-10-CM | POA: Diagnosis not present

## 2020-01-13 DIAGNOSIS — Z0001 Encounter for general adult medical examination with abnormal findings: Secondary | ICD-10-CM | POA: Diagnosis not present

## 2020-01-26 DIAGNOSIS — N183 Chronic kidney disease, stage 3 unspecified: Secondary | ICD-10-CM | POA: Diagnosis not present

## 2020-01-26 DIAGNOSIS — E7849 Other hyperlipidemia: Secondary | ICD-10-CM | POA: Diagnosis not present

## 2020-01-26 DIAGNOSIS — I129 Hypertensive chronic kidney disease with stage 1 through stage 4 chronic kidney disease, or unspecified chronic kidney disease: Secondary | ICD-10-CM | POA: Diagnosis not present

## 2020-01-26 DIAGNOSIS — E1122 Type 2 diabetes mellitus with diabetic chronic kidney disease: Secondary | ICD-10-CM | POA: Diagnosis not present

## 2020-02-07 ENCOUNTER — Encounter: Payer: Self-pay | Admitting: Dermatology

## 2020-03-24 DIAGNOSIS — R300 Dysuria: Secondary | ICD-10-CM | POA: Diagnosis not present

## 2020-03-24 DIAGNOSIS — R319 Hematuria, unspecified: Secondary | ICD-10-CM | POA: Diagnosis not present

## 2020-04-11 ENCOUNTER — Ambulatory Visit: Payer: Medicare Other | Admitting: Dermatology

## 2020-04-20 DIAGNOSIS — Z23 Encounter for immunization: Secondary | ICD-10-CM | POA: Diagnosis not present

## 2020-04-27 DIAGNOSIS — E7849 Other hyperlipidemia: Secondary | ICD-10-CM | POA: Diagnosis not present

## 2020-04-27 DIAGNOSIS — E1122 Type 2 diabetes mellitus with diabetic chronic kidney disease: Secondary | ICD-10-CM | POA: Diagnosis not present

## 2020-04-27 DIAGNOSIS — N183 Chronic kidney disease, stage 3 unspecified: Secondary | ICD-10-CM | POA: Diagnosis not present

## 2020-04-27 DIAGNOSIS — I129 Hypertensive chronic kidney disease with stage 1 through stage 4 chronic kidney disease, or unspecified chronic kidney disease: Secondary | ICD-10-CM | POA: Diagnosis not present

## 2020-05-01 DIAGNOSIS — N281 Cyst of kidney, acquired: Secondary | ICD-10-CM | POA: Diagnosis not present

## 2020-05-01 DIAGNOSIS — N1330 Unspecified hydronephrosis: Secondary | ICD-10-CM | POA: Diagnosis not present

## 2020-05-01 DIAGNOSIS — N398 Other specified disorders of urinary system: Secondary | ICD-10-CM | POA: Diagnosis not present

## 2020-05-01 DIAGNOSIS — N200 Calculus of kidney: Secondary | ICD-10-CM | POA: Diagnosis not present

## 2020-05-12 DIAGNOSIS — N183 Chronic kidney disease, stage 3 unspecified: Secondary | ICD-10-CM | POA: Diagnosis not present

## 2020-05-12 DIAGNOSIS — R7301 Impaired fasting glucose: Secondary | ICD-10-CM | POA: Diagnosis not present

## 2020-05-12 DIAGNOSIS — I10 Essential (primary) hypertension: Secondary | ICD-10-CM | POA: Diagnosis not present

## 2020-05-12 DIAGNOSIS — K2100 Gastro-esophageal reflux disease with esophagitis, without bleeding: Secondary | ICD-10-CM | POA: Diagnosis not present

## 2020-05-12 DIAGNOSIS — E221 Hyperprolactinemia: Secondary | ICD-10-CM | POA: Diagnosis not present

## 2020-05-15 DIAGNOSIS — N138 Other obstructive and reflux uropathy: Secondary | ICD-10-CM | POA: Diagnosis not present

## 2020-05-15 DIAGNOSIS — N398 Other specified disorders of urinary system: Secondary | ICD-10-CM | POA: Diagnosis not present

## 2020-05-15 DIAGNOSIS — N200 Calculus of kidney: Secondary | ICD-10-CM | POA: Diagnosis not present

## 2020-05-15 DIAGNOSIS — R310 Gross hematuria: Secondary | ICD-10-CM | POA: Diagnosis not present

## 2020-05-15 DIAGNOSIS — N281 Cyst of kidney, acquired: Secondary | ICD-10-CM | POA: Diagnosis not present

## 2020-05-16 DIAGNOSIS — D692 Other nonthrombocytopenic purpura: Secondary | ICD-10-CM | POA: Diagnosis not present

## 2020-05-16 DIAGNOSIS — I10 Essential (primary) hypertension: Secondary | ICD-10-CM | POA: Diagnosis not present

## 2020-05-16 DIAGNOSIS — E782 Mixed hyperlipidemia: Secondary | ICD-10-CM | POA: Diagnosis not present

## 2020-05-16 DIAGNOSIS — M791 Myalgia, unspecified site: Secondary | ICD-10-CM | POA: Diagnosis not present

## 2020-05-23 DIAGNOSIS — H524 Presbyopia: Secondary | ICD-10-CM | POA: Diagnosis not present

## 2020-05-23 DIAGNOSIS — H40023 Open angle with borderline findings, high risk, bilateral: Secondary | ICD-10-CM | POA: Diagnosis not present

## 2020-05-31 DIAGNOSIS — Z23 Encounter for immunization: Secondary | ICD-10-CM | POA: Diagnosis not present

## 2020-07-28 DIAGNOSIS — N183 Chronic kidney disease, stage 3 unspecified: Secondary | ICD-10-CM | POA: Diagnosis not present

## 2020-07-28 DIAGNOSIS — E7849 Other hyperlipidemia: Secondary | ICD-10-CM | POA: Diagnosis not present

## 2020-07-28 DIAGNOSIS — I129 Hypertensive chronic kidney disease with stage 1 through stage 4 chronic kidney disease, or unspecified chronic kidney disease: Secondary | ICD-10-CM | POA: Diagnosis not present

## 2020-07-28 DIAGNOSIS — E1122 Type 2 diabetes mellitus with diabetic chronic kidney disease: Secondary | ICD-10-CM | POA: Diagnosis not present

## 2020-08-26 DIAGNOSIS — N183 Chronic kidney disease, stage 3 unspecified: Secondary | ICD-10-CM | POA: Diagnosis not present

## 2020-08-26 DIAGNOSIS — E1122 Type 2 diabetes mellitus with diabetic chronic kidney disease: Secondary | ICD-10-CM | POA: Diagnosis not present

## 2020-08-26 DIAGNOSIS — I129 Hypertensive chronic kidney disease with stage 1 through stage 4 chronic kidney disease, or unspecified chronic kidney disease: Secondary | ICD-10-CM | POA: Diagnosis not present

## 2020-08-26 DIAGNOSIS — E7849 Other hyperlipidemia: Secondary | ICD-10-CM | POA: Diagnosis not present

## 2020-09-07 DIAGNOSIS — R42 Dizziness and giddiness: Secondary | ICD-10-CM | POA: Diagnosis not present

## 2020-09-07 DIAGNOSIS — I10 Essential (primary) hypertension: Secondary | ICD-10-CM | POA: Diagnosis not present

## 2020-09-12 DIAGNOSIS — Z1329 Encounter for screening for other suspected endocrine disorder: Secondary | ICD-10-CM | POA: Diagnosis not present

## 2020-09-12 DIAGNOSIS — I10 Essential (primary) hypertension: Secondary | ICD-10-CM | POA: Diagnosis not present

## 2020-09-12 DIAGNOSIS — E1122 Type 2 diabetes mellitus with diabetic chronic kidney disease: Secondary | ICD-10-CM | POA: Diagnosis not present

## 2020-09-12 DIAGNOSIS — E7849 Other hyperlipidemia: Secondary | ICD-10-CM | POA: Diagnosis not present

## 2020-09-12 DIAGNOSIS — N183 Chronic kidney disease, stage 3 unspecified: Secondary | ICD-10-CM | POA: Diagnosis not present

## 2020-09-12 DIAGNOSIS — K2100 Gastro-esophageal reflux disease with esophagitis, without bleeding: Secondary | ICD-10-CM | POA: Diagnosis not present

## 2020-09-12 DIAGNOSIS — E782 Mixed hyperlipidemia: Secondary | ICD-10-CM | POA: Diagnosis not present

## 2020-09-12 DIAGNOSIS — E1165 Type 2 diabetes mellitus with hyperglycemia: Secondary | ICD-10-CM | POA: Diagnosis not present

## 2020-09-15 DIAGNOSIS — I10 Essential (primary) hypertension: Secondary | ICD-10-CM | POA: Diagnosis not present

## 2020-09-15 DIAGNOSIS — E7849 Other hyperlipidemia: Secondary | ICD-10-CM | POA: Diagnosis not present

## 2020-09-15 DIAGNOSIS — I82409 Acute embolism and thrombosis of unspecified deep veins of unspecified lower extremity: Secondary | ICD-10-CM | POA: Diagnosis not present

## 2020-09-15 DIAGNOSIS — Z86711 Personal history of pulmonary embolism: Secondary | ICD-10-CM | POA: Diagnosis not present

## 2020-09-15 DIAGNOSIS — M791 Myalgia, unspecified site: Secondary | ICD-10-CM | POA: Diagnosis not present

## 2020-10-25 DIAGNOSIS — N183 Chronic kidney disease, stage 3 unspecified: Secondary | ICD-10-CM | POA: Diagnosis not present

## 2020-10-25 DIAGNOSIS — E1122 Type 2 diabetes mellitus with diabetic chronic kidney disease: Secondary | ICD-10-CM | POA: Diagnosis not present

## 2020-10-25 DIAGNOSIS — E7849 Other hyperlipidemia: Secondary | ICD-10-CM | POA: Diagnosis not present

## 2020-10-25 DIAGNOSIS — I129 Hypertensive chronic kidney disease with stage 1 through stage 4 chronic kidney disease, or unspecified chronic kidney disease: Secondary | ICD-10-CM | POA: Diagnosis not present

## 2020-10-26 DIAGNOSIS — R079 Chest pain, unspecified: Secondary | ICD-10-CM | POA: Diagnosis not present

## 2020-10-26 DIAGNOSIS — Z7189 Other specified counseling: Secondary | ICD-10-CM | POA: Diagnosis not present

## 2020-11-09 DIAGNOSIS — N281 Cyst of kidney, acquired: Secondary | ICD-10-CM | POA: Diagnosis not present

## 2020-11-09 DIAGNOSIS — N200 Calculus of kidney: Secondary | ICD-10-CM | POA: Diagnosis not present

## 2020-11-16 DIAGNOSIS — N281 Cyst of kidney, acquired: Secondary | ICD-10-CM | POA: Diagnosis not present

## 2020-11-16 DIAGNOSIS — N200 Calculus of kidney: Secondary | ICD-10-CM | POA: Diagnosis not present

## 2020-11-16 DIAGNOSIS — N138 Other obstructive and reflux uropathy: Secondary | ICD-10-CM | POA: Diagnosis not present

## 2020-11-16 DIAGNOSIS — N398 Other specified disorders of urinary system: Secondary | ICD-10-CM | POA: Diagnosis not present

## 2020-11-25 DIAGNOSIS — E1122 Type 2 diabetes mellitus with diabetic chronic kidney disease: Secondary | ICD-10-CM | POA: Diagnosis not present

## 2020-11-25 DIAGNOSIS — I129 Hypertensive chronic kidney disease with stage 1 through stage 4 chronic kidney disease, or unspecified chronic kidney disease: Secondary | ICD-10-CM | POA: Diagnosis not present

## 2020-11-25 DIAGNOSIS — E7849 Other hyperlipidemia: Secondary | ICD-10-CM | POA: Diagnosis not present

## 2020-11-25 DIAGNOSIS — N183 Chronic kidney disease, stage 3 unspecified: Secondary | ICD-10-CM | POA: Diagnosis not present

## 2020-11-28 DIAGNOSIS — I10 Essential (primary) hypertension: Secondary | ICD-10-CM | POA: Diagnosis not present

## 2020-11-28 DIAGNOSIS — L259 Unspecified contact dermatitis, unspecified cause: Secondary | ICD-10-CM | POA: Diagnosis not present

## 2021-01-04 DIAGNOSIS — Z23 Encounter for immunization: Secondary | ICD-10-CM | POA: Diagnosis not present

## 2021-01-10 ENCOUNTER — Other Ambulatory Visit: Payer: Self-pay

## 2021-01-10 ENCOUNTER — Ambulatory Visit: Payer: Medicare Other | Admitting: Dermatology

## 2021-01-10 DIAGNOSIS — L570 Actinic keratosis: Secondary | ICD-10-CM

## 2021-01-10 DIAGNOSIS — L821 Other seborrheic keratosis: Secondary | ICD-10-CM

## 2021-01-10 DIAGNOSIS — D1801 Hemangioma of skin and subcutaneous tissue: Secondary | ICD-10-CM | POA: Diagnosis not present

## 2021-01-10 DIAGNOSIS — Z1283 Encounter for screening for malignant neoplasm of skin: Secondary | ICD-10-CM

## 2021-01-12 DIAGNOSIS — E782 Mixed hyperlipidemia: Secondary | ICD-10-CM | POA: Diagnosis not present

## 2021-01-12 DIAGNOSIS — E1122 Type 2 diabetes mellitus with diabetic chronic kidney disease: Secondary | ICD-10-CM | POA: Diagnosis not present

## 2021-01-12 DIAGNOSIS — N1831 Chronic kidney disease, stage 3a: Secondary | ICD-10-CM | POA: Diagnosis not present

## 2021-01-12 DIAGNOSIS — K2100 Gastro-esophageal reflux disease with esophagitis, without bleeding: Secondary | ICD-10-CM | POA: Diagnosis not present

## 2021-01-12 DIAGNOSIS — I10 Essential (primary) hypertension: Secondary | ICD-10-CM | POA: Diagnosis not present

## 2021-01-12 DIAGNOSIS — E785 Hyperlipidemia, unspecified: Secondary | ICD-10-CM | POA: Diagnosis not present

## 2021-01-14 ENCOUNTER — Encounter: Payer: Self-pay | Admitting: Dermatology

## 2021-01-14 NOTE — Progress Notes (Signed)
   Follow-Up Visit   Subjective  Ronald Valentine is a 80 y.o. male who presents for the following: Annual Exam (Here for annual skin exam. No concerns. ).  Annual exam, several new crusts Location:  Duration:  Quality:  Associated Signs/Symptoms: Modifying Factors:  Severity:  Timing: Context:   Objective  Well appearing patient in no apparent distress; mood and affect are within normal limits. Full body skin exam.  No atypical pigmented spots or nonmelanoma skin cancer  Multiple 1 to 2 mm smooth red dermal papules  Several dozen 2 to 10 mm brown flattopped textured papules  Head - Anterior (Face), Left Dorsal Hand, Left Forearm - Anterior, Left Lower Leg - Anterior, Scalp Multiple 3 to 5 mm gritty pink crusts    A full examination was performed including scalp, head, eyes, ears, nose, lips, neck, chest, axillae, abdomen, back, buttocks, bilateral upper extremities, bilateral lower extremities, hands, feet, fingers, toes, fingernails, and toenails. All findings within normal limits unless otherwise noted below.   Assessment & Plan    Screening for malignant neoplasm of skin  Yearly skin checks.   AK (actinic keratosis) (5) Head - Anterior (Face); Left Forearm - Anterior; Left Lower Leg - Anterior; Left Dorsal Hand; Scalp  Top left hand and left lower leg. Harder freeze. If freeze fails patient is to follow up.  Destruction of lesion - Head - Anterior (Face), Left Dorsal Hand, Left Forearm - Anterior, Scalp Complexity: simple   Destruction method: cryotherapy   Informed consent: discussed and consent obtained   Timeout:  patient name, date of birth, surgical site, and procedure verified Lesion destroyed using liquid nitrogen: Yes   Cryotherapy cycles:  3 Outcome: patient tolerated procedure well with no complications   Post-procedure details: wound care instructions given    Hemangioma of skin  No intervention necessary  Seborrheic keratosis  Leave if  stable      I, Lavonna Monarch, MD, have reviewed all documentation for this visit.  The documentation on 01/14/21 for the exam, diagnosis, procedures, and orders are all accurate and complete.

## 2021-01-15 DIAGNOSIS — E7849 Other hyperlipidemia: Secondary | ICD-10-CM | POA: Diagnosis not present

## 2021-01-15 DIAGNOSIS — Z0001 Encounter for general adult medical examination with abnormal findings: Secondary | ICD-10-CM | POA: Diagnosis not present

## 2021-01-15 DIAGNOSIS — N1831 Chronic kidney disease, stage 3a: Secondary | ICD-10-CM | POA: Diagnosis not present

## 2021-01-15 DIAGNOSIS — Z23 Encounter for immunization: Secondary | ICD-10-CM | POA: Diagnosis not present

## 2021-01-15 DIAGNOSIS — E782 Mixed hyperlipidemia: Secondary | ICD-10-CM | POA: Diagnosis not present

## 2021-01-15 DIAGNOSIS — I10 Essential (primary) hypertension: Secondary | ICD-10-CM | POA: Diagnosis not present

## 2021-01-15 DIAGNOSIS — I82409 Acute embolism and thrombosis of unspecified deep veins of unspecified lower extremity: Secondary | ICD-10-CM | POA: Diagnosis not present

## 2021-01-15 DIAGNOSIS — M791 Myalgia, unspecified site: Secondary | ICD-10-CM | POA: Diagnosis not present

## 2021-01-31 DIAGNOSIS — I10 Essential (primary) hypertension: Secondary | ICD-10-CM | POA: Diagnosis not present

## 2021-02-25 DIAGNOSIS — N183 Chronic kidney disease, stage 3 unspecified: Secondary | ICD-10-CM | POA: Diagnosis not present

## 2021-02-25 DIAGNOSIS — E7849 Other hyperlipidemia: Secondary | ICD-10-CM | POA: Diagnosis not present

## 2021-02-25 DIAGNOSIS — I129 Hypertensive chronic kidney disease with stage 1 through stage 4 chronic kidney disease, or unspecified chronic kidney disease: Secondary | ICD-10-CM | POA: Diagnosis not present

## 2021-02-25 DIAGNOSIS — E1122 Type 2 diabetes mellitus with diabetic chronic kidney disease: Secondary | ICD-10-CM | POA: Diagnosis not present

## 2021-03-27 DIAGNOSIS — R609 Edema, unspecified: Secondary | ICD-10-CM | POA: Diagnosis not present

## 2021-03-28 DIAGNOSIS — I82812 Embolism and thrombosis of superficial veins of left lower extremities: Secondary | ICD-10-CM | POA: Diagnosis not present

## 2021-03-28 DIAGNOSIS — I82532 Chronic embolism and thrombosis of left popliteal vein: Secondary | ICD-10-CM | POA: Diagnosis not present

## 2021-03-28 DIAGNOSIS — R600 Localized edema: Secondary | ICD-10-CM | POA: Diagnosis not present

## 2021-03-28 DIAGNOSIS — I82512 Chronic embolism and thrombosis of left femoral vein: Secondary | ICD-10-CM | POA: Diagnosis not present

## 2021-04-12 DIAGNOSIS — I080 Rheumatic disorders of both mitral and aortic valves: Secondary | ICD-10-CM | POA: Diagnosis not present

## 2021-04-12 DIAGNOSIS — I700 Atherosclerosis of aorta: Secondary | ICD-10-CM | POA: Diagnosis not present

## 2021-04-18 DIAGNOSIS — I10 Essential (primary) hypertension: Secondary | ICD-10-CM | POA: Diagnosis not present

## 2021-04-18 DIAGNOSIS — K2100 Gastro-esophageal reflux disease with esophagitis, without bleeding: Secondary | ICD-10-CM | POA: Diagnosis not present

## 2021-04-18 DIAGNOSIS — E7849 Other hyperlipidemia: Secondary | ICD-10-CM | POA: Diagnosis not present

## 2021-04-18 DIAGNOSIS — N1831 Chronic kidney disease, stage 3a: Secondary | ICD-10-CM | POA: Diagnosis not present

## 2021-04-18 DIAGNOSIS — E1122 Type 2 diabetes mellitus with diabetic chronic kidney disease: Secondary | ICD-10-CM | POA: Diagnosis not present

## 2021-04-18 DIAGNOSIS — E782 Mixed hyperlipidemia: Secondary | ICD-10-CM | POA: Diagnosis not present

## 2021-04-24 DIAGNOSIS — Z23 Encounter for immunization: Secondary | ICD-10-CM | POA: Diagnosis not present

## 2021-04-24 DIAGNOSIS — I10 Essential (primary) hypertension: Secondary | ICD-10-CM | POA: Diagnosis not present

## 2021-04-24 DIAGNOSIS — I82409 Acute embolism and thrombosis of unspecified deep veins of unspecified lower extremity: Secondary | ICD-10-CM | POA: Diagnosis not present

## 2021-04-24 DIAGNOSIS — N1831 Chronic kidney disease, stage 3a: Secondary | ICD-10-CM | POA: Diagnosis not present

## 2021-04-24 DIAGNOSIS — I350 Nonrheumatic aortic (valve) stenosis: Secondary | ICD-10-CM | POA: Diagnosis not present

## 2021-04-24 DIAGNOSIS — E7849 Other hyperlipidemia: Secondary | ICD-10-CM | POA: Diagnosis not present

## 2021-04-24 DIAGNOSIS — M791 Myalgia, unspecified site: Secondary | ICD-10-CM | POA: Diagnosis not present

## 2021-05-28 DIAGNOSIS — H524 Presbyopia: Secondary | ICD-10-CM | POA: Diagnosis not present

## 2021-05-28 DIAGNOSIS — H40053 Ocular hypertension, bilateral: Secondary | ICD-10-CM | POA: Diagnosis not present

## 2021-05-28 DIAGNOSIS — I129 Hypertensive chronic kidney disease with stage 1 through stage 4 chronic kidney disease, or unspecified chronic kidney disease: Secondary | ICD-10-CM | POA: Diagnosis not present

## 2021-05-28 DIAGNOSIS — E7849 Other hyperlipidemia: Secondary | ICD-10-CM | POA: Diagnosis not present

## 2021-05-28 DIAGNOSIS — E1122 Type 2 diabetes mellitus with diabetic chronic kidney disease: Secondary | ICD-10-CM | POA: Diagnosis not present

## 2021-05-28 DIAGNOSIS — N183 Chronic kidney disease, stage 3 unspecified: Secondary | ICD-10-CM | POA: Diagnosis not present

## 2021-05-31 DIAGNOSIS — N281 Cyst of kidney, acquired: Secondary | ICD-10-CM | POA: Diagnosis not present

## 2021-05-31 DIAGNOSIS — N138 Other obstructive and reflux uropathy: Secondary | ICD-10-CM | POA: Diagnosis not present

## 2021-05-31 DIAGNOSIS — N200 Calculus of kidney: Secondary | ICD-10-CM | POA: Diagnosis not present

## 2021-05-31 DIAGNOSIS — N398 Other specified disorders of urinary system: Secondary | ICD-10-CM | POA: Diagnosis not present

## 2021-07-30 DIAGNOSIS — R059 Cough, unspecified: Secondary | ICD-10-CM | POA: Diagnosis not present

## 2021-08-24 DIAGNOSIS — N1831 Chronic kidney disease, stage 3a: Secondary | ICD-10-CM | POA: Diagnosis not present

## 2021-08-24 DIAGNOSIS — E7849 Other hyperlipidemia: Secondary | ICD-10-CM | POA: Diagnosis not present

## 2021-08-24 DIAGNOSIS — Z1329 Encounter for screening for other suspected endocrine disorder: Secondary | ICD-10-CM | POA: Diagnosis not present

## 2021-08-24 DIAGNOSIS — I10 Essential (primary) hypertension: Secondary | ICD-10-CM | POA: Diagnosis not present

## 2021-08-24 DIAGNOSIS — E782 Mixed hyperlipidemia: Secondary | ICD-10-CM | POA: Diagnosis not present

## 2021-08-24 DIAGNOSIS — E1122 Type 2 diabetes mellitus with diabetic chronic kidney disease: Secondary | ICD-10-CM | POA: Diagnosis not present

## 2021-08-28 DIAGNOSIS — Z86711 Personal history of pulmonary embolism: Secondary | ICD-10-CM | POA: Diagnosis not present

## 2021-08-28 DIAGNOSIS — D692 Other nonthrombocytopenic purpura: Secondary | ICD-10-CM | POA: Diagnosis not present

## 2021-08-28 DIAGNOSIS — I10 Essential (primary) hypertension: Secondary | ICD-10-CM | POA: Diagnosis not present

## 2021-08-28 DIAGNOSIS — M791 Myalgia, unspecified site: Secondary | ICD-10-CM | POA: Diagnosis not present

## 2021-08-28 DIAGNOSIS — E7849 Other hyperlipidemia: Secondary | ICD-10-CM | POA: Diagnosis not present

## 2021-08-28 DIAGNOSIS — I82409 Acute embolism and thrombosis of unspecified deep veins of unspecified lower extremity: Secondary | ICD-10-CM | POA: Diagnosis not present

## 2021-08-28 DIAGNOSIS — I350 Nonrheumatic aortic (valve) stenosis: Secondary | ICD-10-CM | POA: Diagnosis not present

## 2021-11-10 DIAGNOSIS — R42 Dizziness and giddiness: Secondary | ICD-10-CM | POA: Diagnosis not present

## 2021-11-10 DIAGNOSIS — H6120 Impacted cerumen, unspecified ear: Secondary | ICD-10-CM | POA: Diagnosis not present

## 2021-11-27 DIAGNOSIS — E7849 Other hyperlipidemia: Secondary | ICD-10-CM | POA: Diagnosis not present

## 2021-11-27 DIAGNOSIS — E1122 Type 2 diabetes mellitus with diabetic chronic kidney disease: Secondary | ICD-10-CM | POA: Diagnosis not present

## 2021-11-27 DIAGNOSIS — I10 Essential (primary) hypertension: Secondary | ICD-10-CM | POA: Diagnosis not present

## 2021-11-27 DIAGNOSIS — E782 Mixed hyperlipidemia: Secondary | ICD-10-CM | POA: Diagnosis not present

## 2021-12-03 DIAGNOSIS — D692 Other nonthrombocytopenic purpura: Secondary | ICD-10-CM | POA: Diagnosis not present

## 2021-12-03 DIAGNOSIS — I82409 Acute embolism and thrombosis of unspecified deep veins of unspecified lower extremity: Secondary | ICD-10-CM | POA: Diagnosis not present

## 2021-12-03 DIAGNOSIS — M791 Myalgia, unspecified site: Secondary | ICD-10-CM | POA: Diagnosis not present

## 2021-12-03 DIAGNOSIS — E1165 Type 2 diabetes mellitus with hyperglycemia: Secondary | ICD-10-CM | POA: Diagnosis not present

## 2021-12-03 DIAGNOSIS — I10 Essential (primary) hypertension: Secondary | ICD-10-CM | POA: Diagnosis not present

## 2021-12-03 DIAGNOSIS — I350 Nonrheumatic aortic (valve) stenosis: Secondary | ICD-10-CM | POA: Diagnosis not present

## 2021-12-19 DIAGNOSIS — R42 Dizziness and giddiness: Secondary | ICD-10-CM | POA: Diagnosis not present

## 2021-12-19 DIAGNOSIS — I10 Essential (primary) hypertension: Secondary | ICD-10-CM | POA: Diagnosis not present

## 2021-12-19 DIAGNOSIS — I350 Nonrheumatic aortic (valve) stenosis: Secondary | ICD-10-CM | POA: Diagnosis not present

## 2022-01-03 DIAGNOSIS — N200 Calculus of kidney: Secondary | ICD-10-CM | POA: Diagnosis not present

## 2022-01-03 DIAGNOSIS — N398 Other specified disorders of urinary system: Secondary | ICD-10-CM | POA: Diagnosis not present

## 2022-01-03 DIAGNOSIS — N138 Other obstructive and reflux uropathy: Secondary | ICD-10-CM | POA: Diagnosis not present

## 2022-01-03 DIAGNOSIS — N281 Cyst of kidney, acquired: Secondary | ICD-10-CM | POA: Diagnosis not present

## 2022-01-14 ENCOUNTER — Ambulatory Visit: Payer: Medicare Other | Admitting: Dermatology

## 2022-01-14 ENCOUNTER — Encounter: Payer: Self-pay | Admitting: Dermatology

## 2022-01-14 DIAGNOSIS — L570 Actinic keratosis: Secondary | ICD-10-CM | POA: Diagnosis not present

## 2022-01-14 DIAGNOSIS — Z85828 Personal history of other malignant neoplasm of skin: Secondary | ICD-10-CM | POA: Diagnosis not present

## 2022-01-14 DIAGNOSIS — Z1283 Encounter for screening for malignant neoplasm of skin: Secondary | ICD-10-CM | POA: Diagnosis not present

## 2022-02-05 DIAGNOSIS — M1712 Unilateral primary osteoarthritis, left knee: Secondary | ICD-10-CM | POA: Diagnosis not present

## 2022-02-05 DIAGNOSIS — M1711 Unilateral primary osteoarthritis, right knee: Secondary | ICD-10-CM | POA: Diagnosis not present

## 2022-02-08 DIAGNOSIS — M1711 Unilateral primary osteoarthritis, right knee: Secondary | ICD-10-CM | POA: Diagnosis not present

## 2022-02-08 DIAGNOSIS — M1712 Unilateral primary osteoarthritis, left knee: Secondary | ICD-10-CM | POA: Diagnosis not present

## 2022-02-09 ENCOUNTER — Encounter: Payer: Self-pay | Admitting: Dermatology

## 2022-02-09 NOTE — Progress Notes (Signed)
   Follow-Up Visit   Subjective  Ronald Valentine is a 81 y.o. male who presents for the following: Annual Exam (Patient here today for skin check, per patient he has a lesion on his left posterior upper arm x 1 year no pain, no bleeding. Recheck lesion on left elbow x years no pain, no change. Check lesion on lower back x several months no pain, no bleeding. And check lesions on left ear x 6 months bleeding with picking. Personal history of non mole skin cancer. No family history of atypical moles, melanoma or non mole skin cancer. ).  General skin examination, several spots concern patient Location:  Duration:  Quality:  Associated Signs/Symptoms: Modifying Factors:  Severity:  Timing: Context:   Objective  Well appearing patient in no apparent distress; mood and affect are within normal limits. Waist Up No atypical nevi (all checked with dermoscopy) or signs of NMSC noted at the time of the visit.   Left Anterior Lobule (2), Mid Parietal Scalp (3), Right Forehead, Right Malar Cheek, Right Temple Multiple gritty to hornlike pink 2 to 4 mm crust    All skin waist up examined.   Assessment & Plan    AK (actinic keratosis) (8) Left Anterior Lobule (2); Mid Parietal Scalp (3); Right Temple; Right Malar Cheek; Right Forehead  Destruction of lesion - Left Anterior Lobule, Mid Parietal Scalp, Right Forehead, Right Malar Cheek, Right Temple Complexity: simple   Destruction method: cryotherapy   Informed consent: discussed and consent obtained   Timeout:  patient name, date of birth, surgical site, and procedure verified Lesion destroyed using liquid nitrogen: Yes   Region frozen until ice ball extended beyond lesion: Yes   Cryotherapy cycles:  5 Outcome: patient tolerated procedure well with no complications    Skin exam for malignant neoplasm Waist Up  Annual skin examination      I, Lavonna Monarch, MD, have reviewed all documentation for this visit.  The documentation  on 02/09/22 for the exam, diagnosis, procedures, and orders are all accurate and complete.

## 2022-02-25 DIAGNOSIS — M1711 Unilateral primary osteoarthritis, right knee: Secondary | ICD-10-CM | POA: Diagnosis not present

## 2022-02-25 DIAGNOSIS — M1712 Unilateral primary osteoarthritis, left knee: Secondary | ICD-10-CM | POA: Diagnosis not present

## 2022-04-03 DIAGNOSIS — E782 Mixed hyperlipidemia: Secondary | ICD-10-CM | POA: Diagnosis not present

## 2022-04-03 DIAGNOSIS — I10 Essential (primary) hypertension: Secondary | ICD-10-CM | POA: Diagnosis not present

## 2022-04-03 DIAGNOSIS — Z1329 Encounter for screening for other suspected endocrine disorder: Secondary | ICD-10-CM | POA: Diagnosis not present

## 2022-04-03 DIAGNOSIS — E1165 Type 2 diabetes mellitus with hyperglycemia: Secondary | ICD-10-CM | POA: Diagnosis not present

## 2022-04-03 DIAGNOSIS — E7849 Other hyperlipidemia: Secondary | ICD-10-CM | POA: Diagnosis not present

## 2022-04-03 DIAGNOSIS — K219 Gastro-esophageal reflux disease without esophagitis: Secondary | ICD-10-CM | POA: Diagnosis not present

## 2022-04-03 DIAGNOSIS — R7301 Impaired fasting glucose: Secondary | ICD-10-CM | POA: Diagnosis not present

## 2022-04-03 DIAGNOSIS — E1122 Type 2 diabetes mellitus with diabetic chronic kidney disease: Secondary | ICD-10-CM | POA: Diagnosis not present

## 2022-04-03 DIAGNOSIS — N183 Chronic kidney disease, stage 3 unspecified: Secondary | ICD-10-CM | POA: Diagnosis not present

## 2022-04-08 DIAGNOSIS — Z0001 Encounter for general adult medical examination with abnormal findings: Secondary | ICD-10-CM | POA: Diagnosis not present

## 2022-04-08 DIAGNOSIS — D692 Other nonthrombocytopenic purpura: Secondary | ICD-10-CM | POA: Diagnosis not present

## 2022-04-08 DIAGNOSIS — I10 Essential (primary) hypertension: Secondary | ICD-10-CM | POA: Diagnosis not present

## 2022-04-08 DIAGNOSIS — E1165 Type 2 diabetes mellitus with hyperglycemia: Secondary | ICD-10-CM | POA: Diagnosis not present

## 2022-04-08 DIAGNOSIS — I82409 Acute embolism and thrombosis of unspecified deep veins of unspecified lower extremity: Secondary | ICD-10-CM | POA: Diagnosis not present

## 2022-04-08 DIAGNOSIS — E7849 Other hyperlipidemia: Secondary | ICD-10-CM | POA: Diagnosis not present

## 2022-04-08 DIAGNOSIS — Z23 Encounter for immunization: Secondary | ICD-10-CM | POA: Diagnosis not present

## 2022-04-08 DIAGNOSIS — M791 Myalgia, unspecified site: Secondary | ICD-10-CM | POA: Diagnosis not present

## 2022-04-08 DIAGNOSIS — Z86711 Personal history of pulmonary embolism: Secondary | ICD-10-CM | POA: Diagnosis not present

## 2022-04-08 DIAGNOSIS — I350 Nonrheumatic aortic (valve) stenosis: Secondary | ICD-10-CM | POA: Diagnosis not present

## 2022-04-10 DIAGNOSIS — M1711 Unilateral primary osteoarthritis, right knee: Secondary | ICD-10-CM | POA: Diagnosis not present

## 2022-04-10 DIAGNOSIS — N1831 Chronic kidney disease, stage 3a: Secondary | ICD-10-CM | POA: Diagnosis not present

## 2022-04-10 DIAGNOSIS — M1712 Unilateral primary osteoarthritis, left knee: Secondary | ICD-10-CM | POA: Diagnosis not present

## 2022-04-10 DIAGNOSIS — E7849 Other hyperlipidemia: Secondary | ICD-10-CM | POA: Diagnosis not present

## 2022-04-10 DIAGNOSIS — I10 Essential (primary) hypertension: Secondary | ICD-10-CM | POA: Diagnosis not present

## 2022-04-23 DIAGNOSIS — I10 Essential (primary) hypertension: Secondary | ICD-10-CM | POA: Diagnosis not present

## 2022-04-23 DIAGNOSIS — I350 Nonrheumatic aortic (valve) stenosis: Secondary | ICD-10-CM | POA: Diagnosis not present

## 2022-04-26 DIAGNOSIS — I350 Nonrheumatic aortic (valve) stenosis: Secondary | ICD-10-CM | POA: Diagnosis not present

## 2022-04-26 DIAGNOSIS — I080 Rheumatic disorders of both mitral and aortic valves: Secondary | ICD-10-CM | POA: Diagnosis not present

## 2022-05-03 DIAGNOSIS — Z23 Encounter for immunization: Secondary | ICD-10-CM | POA: Diagnosis not present

## 2022-05-10 DIAGNOSIS — H524 Presbyopia: Secondary | ICD-10-CM | POA: Diagnosis not present

## 2022-05-10 DIAGNOSIS — H40053 Ocular hypertension, bilateral: Secondary | ICD-10-CM | POA: Diagnosis not present

## 2022-06-12 DIAGNOSIS — M170 Bilateral primary osteoarthritis of knee: Secondary | ICD-10-CM | POA: Diagnosis not present

## 2022-06-12 DIAGNOSIS — D692 Other nonthrombocytopenic purpura: Secondary | ICD-10-CM | POA: Diagnosis not present

## 2022-06-12 DIAGNOSIS — Z86711 Personal history of pulmonary embolism: Secondary | ICD-10-CM | POA: Diagnosis not present

## 2022-06-12 DIAGNOSIS — M1711 Unilateral primary osteoarthritis, right knee: Secondary | ICD-10-CM | POA: Diagnosis not present

## 2022-06-12 DIAGNOSIS — E1165 Type 2 diabetes mellitus with hyperglycemia: Secondary | ICD-10-CM | POA: Diagnosis not present

## 2022-06-12 DIAGNOSIS — I350 Nonrheumatic aortic (valve) stenosis: Secondary | ICD-10-CM | POA: Diagnosis not present

## 2022-06-12 DIAGNOSIS — I82409 Acute embolism and thrombosis of unspecified deep veins of unspecified lower extremity: Secondary | ICD-10-CM | POA: Diagnosis not present

## 2022-06-12 DIAGNOSIS — I10 Essential (primary) hypertension: Secondary | ICD-10-CM | POA: Diagnosis not present

## 2022-06-12 DIAGNOSIS — E7849 Other hyperlipidemia: Secondary | ICD-10-CM | POA: Diagnosis not present

## 2022-06-12 DIAGNOSIS — M791 Myalgia, unspecified site: Secondary | ICD-10-CM | POA: Diagnosis not present

## 2022-07-30 DIAGNOSIS — R059 Cough, unspecified: Secondary | ICD-10-CM | POA: Diagnosis not present

## 2022-07-30 DIAGNOSIS — R030 Elevated blood-pressure reading, without diagnosis of hypertension: Secondary | ICD-10-CM | POA: Diagnosis not present

## 2022-08-08 DIAGNOSIS — N281 Cyst of kidney, acquired: Secondary | ICD-10-CM | POA: Diagnosis not present

## 2022-08-08 DIAGNOSIS — N398 Other specified disorders of urinary system: Secondary | ICD-10-CM | POA: Diagnosis not present

## 2022-08-08 DIAGNOSIS — N138 Other obstructive and reflux uropathy: Secondary | ICD-10-CM | POA: Diagnosis not present

## 2022-08-08 DIAGNOSIS — N200 Calculus of kidney: Secondary | ICD-10-CM | POA: Diagnosis not present

## 2022-08-09 DIAGNOSIS — N183 Chronic kidney disease, stage 3 unspecified: Secondary | ICD-10-CM | POA: Diagnosis not present

## 2022-08-09 DIAGNOSIS — I2699 Other pulmonary embolism without acute cor pulmonale: Secondary | ICD-10-CM | POA: Diagnosis not present

## 2022-08-09 DIAGNOSIS — E1122 Type 2 diabetes mellitus with diabetic chronic kidney disease: Secondary | ICD-10-CM | POA: Diagnosis not present

## 2022-08-09 DIAGNOSIS — K2100 Gastro-esophageal reflux disease with esophagitis, without bleeding: Secondary | ICD-10-CM | POA: Diagnosis not present

## 2022-08-09 DIAGNOSIS — E7849 Other hyperlipidemia: Secondary | ICD-10-CM | POA: Diagnosis not present

## 2022-08-09 DIAGNOSIS — E1165 Type 2 diabetes mellitus with hyperglycemia: Secondary | ICD-10-CM | POA: Diagnosis not present

## 2022-08-09 DIAGNOSIS — I10 Essential (primary) hypertension: Secondary | ICD-10-CM | POA: Diagnosis not present

## 2022-08-09 DIAGNOSIS — E782 Mixed hyperlipidemia: Secondary | ICD-10-CM | POA: Diagnosis not present

## 2022-08-14 DIAGNOSIS — Z86711 Personal history of pulmonary embolism: Secondary | ICD-10-CM | POA: Diagnosis not present

## 2022-08-14 DIAGNOSIS — M1711 Unilateral primary osteoarthritis, right knee: Secondary | ICD-10-CM | POA: Diagnosis not present

## 2022-08-14 DIAGNOSIS — I350 Nonrheumatic aortic (valve) stenosis: Secondary | ICD-10-CM | POA: Diagnosis not present

## 2022-08-14 DIAGNOSIS — M170 Bilateral primary osteoarthritis of knee: Secondary | ICD-10-CM | POA: Diagnosis not present

## 2022-08-14 DIAGNOSIS — I10 Essential (primary) hypertension: Secondary | ICD-10-CM | POA: Diagnosis not present

## 2022-08-14 DIAGNOSIS — I82409 Acute embolism and thrombosis of unspecified deep veins of unspecified lower extremity: Secondary | ICD-10-CM | POA: Diagnosis not present

## 2022-08-14 DIAGNOSIS — E1165 Type 2 diabetes mellitus with hyperglycemia: Secondary | ICD-10-CM | POA: Diagnosis not present

## 2022-08-14 DIAGNOSIS — E7849 Other hyperlipidemia: Secondary | ICD-10-CM | POA: Diagnosis not present

## 2022-08-14 DIAGNOSIS — M791 Myalgia, unspecified site: Secondary | ICD-10-CM | POA: Diagnosis not present

## 2022-10-17 DIAGNOSIS — E7849 Other hyperlipidemia: Secondary | ICD-10-CM | POA: Diagnosis not present

## 2022-10-17 DIAGNOSIS — N1831 Chronic kidney disease, stage 3a: Secondary | ICD-10-CM | POA: Diagnosis not present

## 2022-10-17 DIAGNOSIS — M1711 Unilateral primary osteoarthritis, right knee: Secondary | ICD-10-CM | POA: Diagnosis not present

## 2022-10-17 DIAGNOSIS — M1712 Unilateral primary osteoarthritis, left knee: Secondary | ICD-10-CM | POA: Diagnosis not present

## 2022-10-17 DIAGNOSIS — R030 Elevated blood-pressure reading, without diagnosis of hypertension: Secondary | ICD-10-CM | POA: Diagnosis not present

## 2022-12-02 DIAGNOSIS — I10 Essential (primary) hypertension: Secondary | ICD-10-CM | POA: Diagnosis not present

## 2022-12-02 DIAGNOSIS — R609 Edema, unspecified: Secondary | ICD-10-CM | POA: Diagnosis not present

## 2022-12-12 DIAGNOSIS — M1712 Unilateral primary osteoarthritis, left knee: Secondary | ICD-10-CM | POA: Diagnosis not present

## 2022-12-12 DIAGNOSIS — E7849 Other hyperlipidemia: Secondary | ICD-10-CM | POA: Diagnosis not present

## 2022-12-12 DIAGNOSIS — N1831 Chronic kidney disease, stage 3a: Secondary | ICD-10-CM | POA: Diagnosis not present

## 2022-12-12 DIAGNOSIS — K219 Gastro-esophageal reflux disease without esophagitis: Secondary | ICD-10-CM | POA: Diagnosis not present

## 2022-12-12 DIAGNOSIS — I83893 Varicose veins of bilateral lower extremities with other complications: Secondary | ICD-10-CM | POA: Diagnosis not present

## 2022-12-12 DIAGNOSIS — I10 Essential (primary) hypertension: Secondary | ICD-10-CM | POA: Diagnosis not present

## 2022-12-12 DIAGNOSIS — E782 Mixed hyperlipidemia: Secondary | ICD-10-CM | POA: Diagnosis not present

## 2022-12-12 DIAGNOSIS — L03115 Cellulitis of right lower limb: Secondary | ICD-10-CM | POA: Diagnosis not present

## 2022-12-12 DIAGNOSIS — E119 Type 2 diabetes mellitus without complications: Secondary | ICD-10-CM | POA: Diagnosis not present

## 2022-12-12 DIAGNOSIS — Z7901 Long term (current) use of anticoagulants: Secondary | ICD-10-CM | POA: Diagnosis not present

## 2022-12-12 DIAGNOSIS — D649 Anemia, unspecified: Secondary | ICD-10-CM | POA: Diagnosis not present

## 2022-12-12 DIAGNOSIS — E1165 Type 2 diabetes mellitus with hyperglycemia: Secondary | ICD-10-CM | POA: Diagnosis not present

## 2022-12-12 DIAGNOSIS — E039 Hypothyroidism, unspecified: Secondary | ICD-10-CM | POA: Diagnosis not present

## 2022-12-18 DIAGNOSIS — I350 Nonrheumatic aortic (valve) stenosis: Secondary | ICD-10-CM | POA: Diagnosis not present

## 2022-12-18 DIAGNOSIS — M1711 Unilateral primary osteoarthritis, right knee: Secondary | ICD-10-CM | POA: Diagnosis not present

## 2022-12-18 DIAGNOSIS — D692 Other nonthrombocytopenic purpura: Secondary | ICD-10-CM | POA: Diagnosis not present

## 2022-12-18 DIAGNOSIS — E1165 Type 2 diabetes mellitus with hyperglycemia: Secondary | ICD-10-CM | POA: Diagnosis not present

## 2022-12-18 DIAGNOSIS — E7849 Other hyperlipidemia: Secondary | ICD-10-CM | POA: Diagnosis not present

## 2022-12-18 DIAGNOSIS — Z86711 Personal history of pulmonary embolism: Secondary | ICD-10-CM | POA: Diagnosis not present

## 2022-12-18 DIAGNOSIS — M791 Myalgia, unspecified site: Secondary | ICD-10-CM | POA: Diagnosis not present

## 2022-12-18 DIAGNOSIS — I10 Essential (primary) hypertension: Secondary | ICD-10-CM | POA: Diagnosis not present

## 2022-12-18 DIAGNOSIS — I82409 Acute embolism and thrombosis of unspecified deep veins of unspecified lower extremity: Secondary | ICD-10-CM | POA: Diagnosis not present

## 2022-12-18 DIAGNOSIS — M170 Bilateral primary osteoarthritis of knee: Secondary | ICD-10-CM | POA: Diagnosis not present

## 2023-01-06 ENCOUNTER — Other Ambulatory Visit: Payer: Self-pay | Admitting: *Deleted

## 2023-01-06 DIAGNOSIS — M79605 Pain in left leg: Secondary | ICD-10-CM

## 2023-01-06 DIAGNOSIS — M79604 Pain in right leg: Secondary | ICD-10-CM

## 2023-01-09 ENCOUNTER — Ambulatory Visit (HOSPITAL_COMMUNITY)
Admission: RE | Admit: 2023-01-09 | Discharge: 2023-01-09 | Disposition: A | Discharge status: Home / Self Care | Payer: Medicare Other | Source: Ambulatory Visit | Attending: Vascular Surgery | Admitting: Vascular Surgery

## 2023-01-09 ENCOUNTER — Ambulatory Visit: Payer: Medicare Other | Admitting: Physician Assistant

## 2023-01-09 VITALS — BP 162/100 | HR 71 | Temp 98.6°F | Resp 20 | Ht 74.0 in | Wt 194.0 lb

## 2023-01-09 DIAGNOSIS — I8393 Asymptomatic varicose veins of bilateral lower extremities: Secondary | ICD-10-CM | POA: Diagnosis not present

## 2023-01-09 DIAGNOSIS — I872 Venous insufficiency (chronic) (peripheral): Secondary | ICD-10-CM

## 2023-01-09 DIAGNOSIS — M7989 Other specified soft tissue disorders: Secondary | ICD-10-CM

## 2023-01-09 DIAGNOSIS — Z86718 Personal history of other venous thrombosis and embolism: Secondary | ICD-10-CM

## 2023-01-09 DIAGNOSIS — M79604 Pain in right leg: Secondary | ICD-10-CM

## 2023-01-09 DIAGNOSIS — M79605 Pain in left leg: Secondary | ICD-10-CM | POA: Diagnosis not present

## 2023-01-09 NOTE — Progress Notes (Signed)
Office Note     CC:  follow up Requesting Provider:  Richardean Chimera, MD  HPI: Ronald Valentine is a 82 y.o. (07/23/1941) male who presents for evaluation of varicose veins and skin changes of bilateral lower extremities.  He was referred here by his PCP for evaluation.  He has noticed prominent veins in his lower legs bilaterally for several years.  He has also noticed a progressive darkening of his skin.  He denies any previous venous ulcerations, trauma, or prior vascular interventions.  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  He also had a pulmonary embolism in the past.  He is on Xarelto and is tolerating this well.  He does not wear compression or elevate his legs regularly.  He is not bothered by his varicosities or edema.  He denies tobacco use.   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   Above Right Brow (in situ) (curet, cautery, and 5FU)   Superficial basal cell carcinoma (BCC) 10/19/2015   Right Post Shoulder (curet and 5FU)   Superficial basal cell carcinoma (BCC) 10/19/2015   Left Temple (curet and 5FU)   Superficial basal cell carcinoma (BCC) 10/19/2015   Right Temple (curet and 5FU)   Superficial basal cell carcinoma (BCC) 10/09/2016   Right Temple (curet and 5FU)    Past Surgical History:  Procedure Laterality Date   BIOPSY  02/20/2017   Procedure: BIOPSY;  Surgeon: Malissa Hippo, MD;  [facility];  Service: Endoscopy;;  duodenal   COLONOSCOPY     COLONOSCOPY  10/17/2011    Procedure: COLONOSCOPY;  Surgeon: Malissa Hippo, MD;  [facility];  Service: Endoscopy;  Laterality: N/A;  930   COLONOSCOPY N/A 10/06/2019   Procedure: COLONOSCOPY;  Surgeon: Malissa Hippo, MD;  [facility];  Service: Endoscopy;  Laterality: N/A;   ESOPHAGOGASTRODUODENOSCOPY  12/26/2011   Procedure: ESOPHAGOGASTRODUODENOSCOPY (EGD);  Surgeon: Malissa Hippo, MD;  [facility];  Service: Endoscopy;  Laterality: N/A;  730   ESOPHAGOGASTRODUODENOSCOPY N/A 02/20/2017   Procedure: ESOPHAGOGASTRODUODENOSCOPY (EGD);  Surgeon: Malissa Hippo, MD;  [facility];  Service: Endoscopy;  Laterality: N/A;  2:20   ESOPHAGOGASTRODUODENOSCOPY N/A 10/06/2019   Procedure: ESOPHAGOGASTRODUODENOSCOPY (EGD);  Surgeon: Malissa Hippo, MD;  [facility];  Service: Endoscopy;  Laterality: N/A;  2:00-moved to 3/10 @ 1200 per office   LITHOTRIPSY     POLYPECTOMY  10/06/2019   Procedure: POLYPECTOMY;  Surgeon: Malissa Hippo, MD;  [facility];  Service: Endoscopy;;  colon    Social History   Socioeconomic History   Marital status: Married    Spouse name: Not on file   Number of children: 2   Years of education: Not on file   Highest education level: Not on file  Occupational History   Occupation: Retired-office work     Associate Professor: RETIRED  Tobacco Use   Smoking status: Never   Smokeless tobacco: Never  Substance and Sexual Activity   Alcohol use: No   Drug use: No   Sexual activity: Not on file  Other Topics Concern   Not on file  Social History Narrative   Not on file   Social Determinants of Health   Financial Resource Strain: Not on file  Food Insecurity: Not on file  Transportation Needs: Not on file  Physical Activity: Not on file  Stress: Not on file  Social Connections: Not on file  Intimate Partner Violence: Not on file    Family History  Problem Relation Age of Onset   Hypertension Mother    Colon cancer Neg  Hx     Current Outpatient Medications  Medication Sig Dispense Refill   acetaminophen (TYLENOL) 650 MG CR tablet Take 650 mg by mouth daily.     apixaban (ELIQUIS) 5 MG TABS tablet Take 1 tablet (5 mg total) by mouth 2 (two) times daily. 60 tablet    latanoprost (XALATAN) 0.005 % ophthalmic solution Place 1 drop into the left eye at bedtime.     mirtazapine (REMERON) 30 MG tablet Take 30 mg by mouth at bedtime.     Multiple Vitamins-Minerals (MULTIVITAMIN ADULTS PO) Take 1 tablet by mouth daily.      olmesartan-hydrochlorothiazide (BENICAR HCT) 40-12.5 MG tablet Take 1 tablet by mouth daily.     omeprazole (PRILOSEC) 20 MG capsule Take 20 mg by mouth every other day.      Propylene Glycol (SYSTANE BALANCE) 0.6 % SOLN Apply to eye.     No current facility-administered medications for this visit.    Allergies  Allergen Reactions   Escitalopram Other (See Comments)    agitation   Statins Other (See Comments)   Sulfa Antibiotics Rash     REVIEW OF SYSTEMS:   [X]  denotes positive finding, [ ]  denotes negative finding Cardiac  Comments:  Chest pain or chest pressure:    Shortness of breath upon exertion:    Short of breath when lying flat:    Irregular heart rhythm:        Vascular    Pain in calf, thigh, or hip brought on by ambulation:    Pain in feet at night that wakes you up from your sleep:     Blood clot in your veins:    Leg swelling:         Pulmonary    Oxygen at home:    Productive cough:     Wheezing:         Neurologic    Sudden weakness in arms or legs:     Sudden numbness in arms or legs:     Sudden onset of difficulty speaking or slurred speech:    Temporary loss of vision in one eye:     Problems with dizziness:         Gastrointestinal    Blood in stool:     Vomited blood:         Genitourinary    Burning when urinating:     Blood in urine:        Psychiatric    Major depression:         Hematologic    Bleeding problems:    Problems with  blood clotting too easily:        Skin    Rashes or ulcers:        Constitutional    Fever  or chills:      PHYSICAL EXAMINATION:  Vitals:   01/09/23 1246  BP: (!) 162/100  Pulse: 71  Resp: 20  Temp: 98.6 F (37 C)  SpO2: 94%  Weight: 194 lb (88 kg)  Height: 6\' 2"  (1.88 m)    General:  WDWN in NAD; vital signs documented above Gait: Not observed HENT: WNL, normocephalic Pulmonary: normal non-labored breathing , without Rales, rhonchi,  wheezing Cardiac: regular HR Abdomen: soft, NT, no masses Skin: without rashes Vascular Exam/Pulses: palpable 1+ DP pulses bilaterally; likely difficult to palpate secondary to pitting edema of both feet and ankles Extremities: without ischemic changes, without Gangrene , without cellulitis; without open wounds; varicosities of lower legs right worse than left; hyperpigmentation of both shins with pitting edema involving both ankles and feet Musculoskeletal: no muscle wasting or atrophy  Neurologic: A&O X 3 Psychiatric:  The pt has Normal affect.   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 male here for evaluation of varicose veins right more so than the left  -Mr. Ronald Valentine is an 82 year old male referred to our office for evaluation of asymptomatic varicose veins right worse than left lower leg.  On exam he has obvious hyperpigmentation and varicosities of both lower legs.  Venous reflux study of the right lower extremity demonstrates an incompetent common femoral vein.  He also has a large incompetent small saphenous vein and would be a candidate for small saphenous vein ablation and stab phlebectomy.  He is not bothered by swelling or varicosities and for this reason we will not proceed towards laser ablation therapy or stab phlebectomy.  We will  proceed with conservative management including regular use of knee-high compression 15 to 20 mmHg.  He will also focus on elevating his legs above the level of his heart periodically during the day.  He should avoid prolonged sitting and standing.  In the future if he changes mind or if he would like to check a reflux study of his left lower extremity given history of DVT he will notify our office.  For now he will follow-up on an as-needed basis.   Emilie Rutter, PA-C [facility] [phone]  Clinic MD:   [facility] Spore on call

## 2023-01-16 DIAGNOSIS — D485 Neoplasm of uncertain behavior of skin: Secondary | ICD-10-CM | POA: Diagnosis not present

## 2023-01-16 DIAGNOSIS — C44319 Basal cell carcinoma of skin of other parts of face: Secondary | ICD-10-CM | POA: Diagnosis not present

## 2023-01-16 DIAGNOSIS — D0472 Carcinoma in situ of skin of left lower limb, including hip: Secondary | ICD-10-CM | POA: Diagnosis not present

## 2023-01-16 DIAGNOSIS — Z08 Encounter for follow-up examination after completed treatment for malignant neoplasm: Secondary | ICD-10-CM | POA: Diagnosis not present

## 2023-01-16 DIAGNOSIS — Z85828 Personal history of other malignant neoplasm of skin: Secondary | ICD-10-CM | POA: Diagnosis not present

## 2023-01-16 DIAGNOSIS — L570 Actinic keratosis: Secondary | ICD-10-CM | POA: Diagnosis not present

## 2023-01-16 DIAGNOSIS — L568 Other specified acute skin changes due to ultraviolet radiation: Secondary | ICD-10-CM | POA: Diagnosis not present

## 2023-01-16 DIAGNOSIS — C44311 Basal cell carcinoma of skin of nose: Secondary | ICD-10-CM | POA: Diagnosis not present

## 2023-01-16 DIAGNOSIS — D229 Melanocytic nevi, unspecified: Secondary | ICD-10-CM | POA: Diagnosis not present

## 2023-01-16 DIAGNOSIS — Z1283 Encounter for screening for malignant neoplasm of skin: Secondary | ICD-10-CM | POA: Diagnosis not present

## 2023-01-16 DIAGNOSIS — L821 Other seborrheic keratosis: Secondary | ICD-10-CM | POA: Diagnosis not present

## 2023-02-11 DIAGNOSIS — D0472 Carcinoma in situ of skin of left lower limb, including hip: Secondary | ICD-10-CM | POA: Diagnosis not present

## 2023-02-20 DIAGNOSIS — N138 Other obstructive and reflux uropathy: Secondary | ICD-10-CM | POA: Diagnosis not present

## 2023-02-20 DIAGNOSIS — N281 Cyst of kidney, acquired: Secondary | ICD-10-CM | POA: Diagnosis not present

## 2023-02-20 DIAGNOSIS — N398 Other specified disorders of urinary system: Secondary | ICD-10-CM | POA: Diagnosis not present

## 2023-02-20 DIAGNOSIS — N200 Calculus of kidney: Secondary | ICD-10-CM | POA: Diagnosis not present

## 2023-02-21 DIAGNOSIS — R030 Elevated blood-pressure reading, without diagnosis of hypertension: Secondary | ICD-10-CM | POA: Diagnosis not present

## 2023-02-21 DIAGNOSIS — L03119 Cellulitis of unspecified part of limb: Secondary | ICD-10-CM | POA: Diagnosis not present

## 2023-02-21 DIAGNOSIS — H6692 Otitis media, unspecified, left ear: Secondary | ICD-10-CM | POA: Diagnosis not present

## 2023-02-21 DIAGNOSIS — H6092 Unspecified otitis externa, left ear: Secondary | ICD-10-CM | POA: Diagnosis not present

## 2023-03-04 DIAGNOSIS — R030 Elevated blood-pressure reading, without diagnosis of hypertension: Secondary | ICD-10-CM | POA: Diagnosis not present

## 2023-03-04 DIAGNOSIS — S81802A Unspecified open wound, left lower leg, initial encounter: Secondary | ICD-10-CM | POA: Diagnosis not present

## 2023-03-04 DIAGNOSIS — J029 Acute pharyngitis, unspecified: Secondary | ICD-10-CM | POA: Diagnosis not present

## 2023-03-04 DIAGNOSIS — B370 Candidal stomatitis: Secondary | ICD-10-CM | POA: Diagnosis not present

## 2023-03-20 DIAGNOSIS — C44319 Basal cell carcinoma of skin of other parts of face: Secondary | ICD-10-CM | POA: Diagnosis not present

## 2023-03-28 DIAGNOSIS — M109 Gout, unspecified: Secondary | ICD-10-CM | POA: Diagnosis not present

## 2023-03-28 DIAGNOSIS — M7989 Other specified soft tissue disorders: Secondary | ICD-10-CM | POA: Diagnosis not present

## 2023-03-28 DIAGNOSIS — M25571 Pain in right ankle and joints of right foot: Secondary | ICD-10-CM | POA: Diagnosis not present

## 2023-04-03 DIAGNOSIS — E559 Vitamin D deficiency, unspecified: Secondary | ICD-10-CM | POA: Diagnosis not present

## 2023-04-03 DIAGNOSIS — E782 Mixed hyperlipidemia: Secondary | ICD-10-CM | POA: Diagnosis not present

## 2023-04-03 DIAGNOSIS — I10 Essential (primary) hypertension: Secondary | ICD-10-CM | POA: Diagnosis not present

## 2023-04-03 DIAGNOSIS — E1122 Type 2 diabetes mellitus with diabetic chronic kidney disease: Secondary | ICD-10-CM | POA: Diagnosis not present

## 2023-04-03 DIAGNOSIS — Z0001 Encounter for general adult medical examination with abnormal findings: Secondary | ICD-10-CM | POA: Diagnosis not present

## 2023-04-03 DIAGNOSIS — E039 Hypothyroidism, unspecified: Secondary | ICD-10-CM | POA: Diagnosis not present

## 2023-04-03 DIAGNOSIS — Z1329 Encounter for screening for other suspected endocrine disorder: Secondary | ICD-10-CM | POA: Diagnosis not present

## 2023-04-03 DIAGNOSIS — E7849 Other hyperlipidemia: Secondary | ICD-10-CM | POA: Diagnosis not present

## 2023-04-07 DIAGNOSIS — C44311 Basal cell carcinoma of skin of nose: Secondary | ICD-10-CM | POA: Diagnosis not present

## 2023-04-10 DIAGNOSIS — E7849 Other hyperlipidemia: Secondary | ICD-10-CM | POA: Diagnosis not present

## 2023-04-10 DIAGNOSIS — E1165 Type 2 diabetes mellitus with hyperglycemia: Secondary | ICD-10-CM | POA: Diagnosis not present

## 2023-04-10 DIAGNOSIS — M791 Myalgia, unspecified site: Secondary | ICD-10-CM | POA: Diagnosis not present

## 2023-04-10 DIAGNOSIS — I10 Essential (primary) hypertension: Secondary | ICD-10-CM | POA: Diagnosis not present

## 2023-04-10 DIAGNOSIS — D692 Other nonthrombocytopenic purpura: Secondary | ICD-10-CM | POA: Diagnosis not present

## 2023-04-10 DIAGNOSIS — I82409 Acute embolism and thrombosis of unspecified deep veins of unspecified lower extremity: Secondary | ICD-10-CM | POA: Diagnosis not present

## 2023-04-10 DIAGNOSIS — Z0001 Encounter for general adult medical examination with abnormal findings: Secondary | ICD-10-CM | POA: Diagnosis not present

## 2023-04-10 DIAGNOSIS — Z23 Encounter for immunization: Secondary | ICD-10-CM | POA: Diagnosis not present

## 2023-04-10 DIAGNOSIS — M109 Gout, unspecified: Secondary | ICD-10-CM | POA: Diagnosis not present

## 2023-04-10 DIAGNOSIS — I350 Nonrheumatic aortic (valve) stenosis: Secondary | ICD-10-CM | POA: Diagnosis not present

## 2023-04-10 DIAGNOSIS — Z86711 Personal history of pulmonary embolism: Secondary | ICD-10-CM | POA: Diagnosis not present

## 2023-04-17 DIAGNOSIS — C44311 Basal cell carcinoma of skin of nose: Secondary | ICD-10-CM | POA: Diagnosis not present

## 2023-04-24 DIAGNOSIS — I10 Essential (primary) hypertension: Secondary | ICD-10-CM | POA: Diagnosis not present

## 2023-04-24 DIAGNOSIS — I484 Atypical atrial flutter: Secondary | ICD-10-CM | POA: Diagnosis not present

## 2023-04-24 DIAGNOSIS — Z86718 Personal history of other venous thrombosis and embolism: Secondary | ICD-10-CM | POA: Diagnosis not present

## 2023-04-24 DIAGNOSIS — I350 Nonrheumatic aortic (valve) stenosis: Secondary | ICD-10-CM | POA: Diagnosis not present

## 2023-04-24 DIAGNOSIS — E785 Hyperlipidemia, unspecified: Secondary | ICD-10-CM | POA: Diagnosis not present

## 2023-05-01 DIAGNOSIS — M25562 Pain in left knee: Secondary | ICD-10-CM | POA: Diagnosis not present

## 2023-05-01 DIAGNOSIS — M25561 Pain in right knee: Secondary | ICD-10-CM | POA: Diagnosis not present

## 2023-05-01 DIAGNOSIS — M170 Bilateral primary osteoarthritis of knee: Secondary | ICD-10-CM | POA: Diagnosis not present

## 2023-05-05 DIAGNOSIS — H401124 Primary open-angle glaucoma, left eye, indeterminate stage: Secondary | ICD-10-CM | POA: Diagnosis not present

## 2023-05-05 DIAGNOSIS — H524 Presbyopia: Secondary | ICD-10-CM | POA: Diagnosis not present

## 2023-05-08 DIAGNOSIS — I080 Rheumatic disorders of both mitral and aortic valves: Secondary | ICD-10-CM | POA: Diagnosis not present

## 2023-05-11 DIAGNOSIS — I350 Nonrheumatic aortic (valve) stenosis: Secondary | ICD-10-CM | POA: Diagnosis not present

## 2023-07-07 DIAGNOSIS — H401124 Primary open-angle glaucoma, left eye, indeterminate stage: Secondary | ICD-10-CM | POA: Diagnosis not present

## 2023-08-08 DIAGNOSIS — M109 Gout, unspecified: Secondary | ICD-10-CM | POA: Diagnosis not present

## 2023-08-08 DIAGNOSIS — E119 Type 2 diabetes mellitus without complications: Secondary | ICD-10-CM | POA: Diagnosis not present

## 2023-08-08 DIAGNOSIS — E782 Mixed hyperlipidemia: Secondary | ICD-10-CM | POA: Diagnosis not present

## 2023-08-08 DIAGNOSIS — N1831 Chronic kidney disease, stage 3a: Secondary | ICD-10-CM | POA: Diagnosis not present

## 2023-08-08 DIAGNOSIS — K219 Gastro-esophageal reflux disease without esophagitis: Secondary | ICD-10-CM | POA: Diagnosis not present

## 2023-08-08 DIAGNOSIS — E7849 Other hyperlipidemia: Secondary | ICD-10-CM | POA: Diagnosis not present

## 2023-08-14 DIAGNOSIS — I82409 Acute embolism and thrombosis of unspecified deep veins of unspecified lower extremity: Secondary | ICD-10-CM | POA: Diagnosis not present

## 2023-08-14 DIAGNOSIS — D692 Other nonthrombocytopenic purpura: Secondary | ICD-10-CM | POA: Diagnosis not present

## 2023-08-14 DIAGNOSIS — I350 Nonrheumatic aortic (valve) stenosis: Secondary | ICD-10-CM | POA: Diagnosis not present

## 2023-09-23 DIAGNOSIS — M170 Bilateral primary osteoarthritis of knee: Secondary | ICD-10-CM | POA: Diagnosis not present

## 2023-09-29 DIAGNOSIS — H401131 Primary open-angle glaucoma, bilateral, mild stage: Secondary | ICD-10-CM | POA: Diagnosis not present

## 2023-10-27 DIAGNOSIS — I350 Nonrheumatic aortic (valve) stenosis: Secondary | ICD-10-CM | POA: Diagnosis not present

## 2023-10-27 DIAGNOSIS — R3912 Poor urinary stream: Secondary | ICD-10-CM | POA: Diagnosis not present

## 2023-10-27 DIAGNOSIS — I10 Essential (primary) hypertension: Secondary | ICD-10-CM | POA: Diagnosis not present

## 2023-10-27 DIAGNOSIS — Z86718 Personal history of other venous thrombosis and embolism: Secondary | ICD-10-CM | POA: Diagnosis not present

## 2023-10-27 DIAGNOSIS — E785 Hyperlipidemia, unspecified: Secondary | ICD-10-CM | POA: Diagnosis not present

## 2023-10-27 DIAGNOSIS — I484 Atypical atrial flutter: Secondary | ICD-10-CM | POA: Diagnosis not present

## 2023-12-09 DIAGNOSIS — M25551 Pain in right hip: Secondary | ICD-10-CM | POA: Diagnosis not present

## 2023-12-09 DIAGNOSIS — M25521 Pain in right elbow: Secondary | ICD-10-CM | POA: Diagnosis not present

## 2023-12-09 DIAGNOSIS — R7301 Impaired fasting glucose: Secondary | ICD-10-CM | POA: Diagnosis not present

## 2023-12-09 DIAGNOSIS — E1165 Type 2 diabetes mellitus with hyperglycemia: Secondary | ICD-10-CM | POA: Diagnosis not present

## 2023-12-09 DIAGNOSIS — Z1329 Encounter for screening for other suspected endocrine disorder: Secondary | ICD-10-CM | POA: Diagnosis not present

## 2023-12-09 DIAGNOSIS — E559 Vitamin D deficiency, unspecified: Secondary | ICD-10-CM | POA: Diagnosis not present

## 2023-12-09 DIAGNOSIS — Z13220 Encounter for screening for lipoid disorders: Secondary | ICD-10-CM | POA: Diagnosis not present

## 2023-12-09 DIAGNOSIS — D559 Anemia due to enzyme disorder, unspecified: Secondary | ICD-10-CM | POA: Diagnosis not present

## 2023-12-09 DIAGNOSIS — I10 Essential (primary) hypertension: Secondary | ICD-10-CM | POA: Diagnosis not present

## 2023-12-11 DIAGNOSIS — N281 Cyst of kidney, acquired: Secondary | ICD-10-CM | POA: Diagnosis not present

## 2023-12-11 DIAGNOSIS — N398 Other specified disorders of urinary system: Secondary | ICD-10-CM | POA: Diagnosis not present

## 2023-12-11 DIAGNOSIS — N200 Calculus of kidney: Secondary | ICD-10-CM | POA: Diagnosis not present

## 2023-12-11 DIAGNOSIS — N138 Other obstructive and reflux uropathy: Secondary | ICD-10-CM | POA: Diagnosis not present

## 2023-12-16 DIAGNOSIS — D692 Other nonthrombocytopenic purpura: Secondary | ICD-10-CM | POA: Diagnosis not present

## 2023-12-16 DIAGNOSIS — I350 Nonrheumatic aortic (valve) stenosis: Secondary | ICD-10-CM | POA: Diagnosis not present

## 2023-12-16 DIAGNOSIS — I82409 Acute embolism and thrombosis of unspecified deep veins of unspecified lower extremity: Secondary | ICD-10-CM | POA: Diagnosis not present

## 2024-01-08 DIAGNOSIS — G4700 Insomnia, unspecified: Secondary | ICD-10-CM | POA: Diagnosis not present

## 2024-01-08 DIAGNOSIS — Z86718 Personal history of other venous thrombosis and embolism: Secondary | ICD-10-CM | POA: Diagnosis not present

## 2024-01-08 DIAGNOSIS — Z86711 Personal history of pulmonary embolism: Secondary | ICD-10-CM | POA: Diagnosis not present

## 2024-01-08 DIAGNOSIS — R7303 Prediabetes: Secondary | ICD-10-CM | POA: Diagnosis not present

## 2024-01-08 DIAGNOSIS — Z8739 Personal history of other diseases of the musculoskeletal system and connective tissue: Secondary | ICD-10-CM | POA: Diagnosis not present

## 2024-01-08 DIAGNOSIS — I10 Essential (primary) hypertension: Secondary | ICD-10-CM | POA: Diagnosis not present

## 2024-01-08 DIAGNOSIS — Z136 Encounter for screening for cardiovascular disorders: Secondary | ICD-10-CM | POA: Diagnosis not present

## 2024-01-19 DIAGNOSIS — H25813 Combined forms of age-related cataract, bilateral: Secondary | ICD-10-CM | POA: Diagnosis not present

## 2024-02-19 DIAGNOSIS — N200 Calculus of kidney: Secondary | ICD-10-CM | POA: Diagnosis not present

## 2024-02-19 DIAGNOSIS — N398 Other specified disorders of urinary system: Secondary | ICD-10-CM | POA: Diagnosis not present

## 2024-02-19 DIAGNOSIS — N138 Other obstructive and reflux uropathy: Secondary | ICD-10-CM | POA: Diagnosis not present

## 2024-02-19 DIAGNOSIS — N281 Cyst of kidney, acquired: Secondary | ICD-10-CM | POA: Diagnosis not present

## 2024-03-16 ENCOUNTER — Ambulatory Visit

## 2024-03-16 DIAGNOSIS — Z85828 Personal history of other malignant neoplasm of skin: Secondary | ICD-10-CM | POA: Diagnosis not present

## 2024-03-16 DIAGNOSIS — L578 Other skin changes due to chronic exposure to nonionizing radiation: Secondary | ICD-10-CM | POA: Diagnosis not present

## 2024-03-16 DIAGNOSIS — C44219 Basal cell carcinoma of skin of left ear and external auricular canal: Secondary | ICD-10-CM

## 2024-03-16 DIAGNOSIS — C44320 Squamous cell carcinoma of skin of unspecified parts of face: Secondary | ICD-10-CM | POA: Diagnosis not present

## 2024-03-16 DIAGNOSIS — Z1283 Encounter for screening for malignant neoplasm of skin: Secondary | ICD-10-CM

## 2024-03-16 DIAGNOSIS — D492 Neoplasm of unspecified behavior of bone, soft tissue, and skin: Secondary | ICD-10-CM | POA: Diagnosis not present

## 2024-03-16 DIAGNOSIS — D0472 Carcinoma in situ of skin of left lower limb, including hip: Secondary | ICD-10-CM | POA: Diagnosis not present

## 2024-03-16 DIAGNOSIS — L570 Actinic keratosis: Secondary | ICD-10-CM | POA: Diagnosis not present

## 2024-03-16 DIAGNOSIS — C44310 Basal cell carcinoma of skin of unspecified parts of face: Secondary | ICD-10-CM

## 2024-03-16 DIAGNOSIS — L820 Inflamed seborrheic keratosis: Secondary | ICD-10-CM | POA: Diagnosis not present

## 2024-03-16 DIAGNOSIS — D1801 Hemangioma of skin and subcutaneous tissue: Secondary | ICD-10-CM

## 2024-03-16 DIAGNOSIS — C4492 Squamous cell carcinoma of skin, unspecified: Secondary | ICD-10-CM

## 2024-03-16 DIAGNOSIS — W908XXA Exposure to other nonionizing radiation, initial encounter: Secondary | ICD-10-CM | POA: Diagnosis not present

## 2024-03-16 DIAGNOSIS — C4491 Basal cell carcinoma of skin, unspecified: Secondary | ICD-10-CM

## 2024-03-16 HISTORY — DX: Basal cell carcinoma of skin, unspecified: C44.91

## 2024-03-16 HISTORY — DX: Squamous cell carcinoma of skin, unspecified: C44.92

## 2024-03-16 NOTE — Patient Instructions (Addendum)
 Wound Care Instructions  Cleanse wound gently with soap and water  once a day then pat dry with clean gauze. Apply a thin coat of Petrolatum (petroleum jelly, Vaseline) over the wound (unless you have an allergy to this). We recommend that you use a new, sterile tube of Vaseline. Do not pick or remove scabs. Do not remove the yellow or white healing tissue from the base of the wound.  Cover the wound with fresh, clean, nonstick gauze and secure with paper tape. You may use Band-Aids in place of gauze and tape if the wound is small enough, but would recommend trimming much of the tape off as there is often too much. Sometimes Band-Aids can irritate the skin.  You should call the office for your biopsy report after 1 week if you have not already been contacted.  If you experience any problems, such as abnormal amounts of bleeding, swelling, significant bruising, significant pain, or evidence of infection, please call the office immediately.  FOR ADULT SURGERY PATIENTS: If you need something for pain relief you may take 1 extra strength Tylenol (acetaminophen) AND 2 Ibuprofen (200mg  each) together every 4 hours as needed for pain. (do not take these if you are allergic to them or if you have a reason you should not take them.) Typically, you may only need pain medication for 1 to 3 days.         Cryotherapy Aftercare  Wash gently with soap and water  everyday.   Apply Vaseline and Band-Aid daily until healed.        Melanoma ABCDEs  Melanoma is the most dangerous type of skin cancer, and is the leading cause of death from skin disease.  You are more likely to develop melanoma if you: Have light-colored skin, light-colored eyes, or red or blond hair Spend a lot of time in the sun Tan regularly, either outdoors or in a tanning bed Have had blistering sunburns, especially during childhood Have a close family member who has had a melanoma Have atypical moles or large  birthmarks  Early detection of melanoma is key since treatment is typically straightforward and cure rates are extremely high if we catch it early.   The first sign of melanoma is often a change in a mole or a new dark spot.  The ABCDE system is a way of remembering the signs of melanoma.  A for asymmetry:  The two halves do not match. B for border:  The edges of the growth are irregular. C for color:  A mixture of colors are present instead of an even brown color. D for diameter:  Melanomas are usually (but not always) greater than 6mm - the size of a pencil eraser. E for evolution:  The spot keeps changing in size, shape, and color.  Please check your skin once per month between visits. You can use a small mirror in front and a large mirror behind you to keep an eye on the back side or your body.   If you see any new or changing lesions before your next follow-up, please call to schedule a visit.  Please continue daily skin protection including broad spectrum sunscreen SPF 30+ to sun-exposed areas, reapplying every 2 hours as needed when you're outdoors.        Due to recent changes in healthcare laws, you may see results of your pathology and/or laboratory studies on MyChart before the doctors have had a chance to review them. We understand that in some cases there may  be results that are confusing or concerning to you. Please understand that not all results are received at the same time and often the doctors may need to interpret multiple results in order to provide you with the best plan of care or course of treatment. Therefore, we ask that you please give us  2 business days to thoroughly review all your results before contacting the office for clarification. Should we see a critical lab result, you will be contacted sooner.   If You Need Anything After Your Visit  If you have any questions or concerns for your doctor, please call our main line at [phone] and press option 4 to  reach your doctor's medical assistant. If no one answers, please leave a voicemail as directed and we will return your call as soon as possible. Messages left after 4 pm will be answered the following business day.   You may also send us  a message via MyChart. We typically respond to MyChart messages within 1-2 business days.  For prescription refills, please ask your pharmacy to contact our office. Our fax number is [phone].  If you have an urgent issue when the clinic is closed that cannot wait until the next business day, you can page your doctor at the number below.    Please note that while we do our best to be available for urgent issues outside of office hours, we are not available 24/7.   If you have an urgent issue and are unable to reach us , you may choose to seek medical care at your doctor's office, retail clinic, urgent care center, or emergency room.  If you have a medical emergency, please immediately call 911 or go to the emergency department.  Pager Numbers  - Dr. Hester: [phone]  - Dr. Jackquline: [phone]  - Dr. Claudene: [phone]   - Dr. Raymund: [phone]  In the event of inclement weather, please call our main line at [phone] for an update on the status of any delays or closures.  Dermatology Medication Tips: Please keep the boxes that topical medications come in in order to help keep track of the instructions about where and how to use these. Pharmacies typically print the medication instructions only on the boxes and not directly on the medication tubes.   If your medication is too expensive, please contact our office at [phone] option 4 or send us  a message through MyChart.   We are unable to tell what your co-pay for medications will be in advance as this is different depending on your insurance coverage. However, we may be able to find a substitute medication at lower cost or fill out paperwork to get insurance to cover a needed  medication.   If a prior authorization is required to get your medication covered by your insurance company, please allow us  1-2 business days to complete this process.  Drug prices often vary depending on where the prescription is filled and some pharmacies may offer cheaper prices.  The website www.goodrx.com contains coupons for medications through different pharmacies. The prices here do not account for what the cost may be with help from insurance (it may be cheaper with your insurance), but the website can give you the price if you did not use any insurance.  - You can print the associated coupon and take it with your prescription to the pharmacy.  - You may also stop by our office during regular business hours and pick up a GoodRx coupon card.  - If you need  your prescription sent electronically to a different pharmacy, notify our office through [facility] or by phone at [phone] option 4.     Si Usted Necesita Algo Despus de Su Visita  Tambin puede enviarnos un mensaje a travs de Clinical cytogeneticist. Por lo general respondemos a los mensajes de MyChart en el transcurso de 1 a 2 das hbiles.  Para renovar recetas, por favor pida a su farmacia que se ponga en contacto con nuestra oficina. Randi lakes de fax es [place] [phone].  Si tiene un asunto urgente cuando la clnica est cerrada y que no puede esperar hasta el siguiente da hbil, puede llamar/localizar a su doctor(a) al nmero que aparece a continuacin.   Por favor, tenga en cuenta que aunque hacemos todo lo posible para estar disponibles para asuntos urgentes fuera del horario de [place], no estamos disponibles las 24 horas del da, los 7 [address]  [address] de la [place].   Si tiene un problema urgente y no puede comunicarse con nosotros, puede optar por buscar atencin mdica  en el consultorio de su doctor(a), en una clnica privada, en un centro de atencin urgente o en una sala de emergencias.  Si tiene Engineer, drilling,  por favor llame inmediatamente al 911 o vaya a la sala de emergencias.  Nmeros de bper  - Dr. Hester: [phone]  - Dra. Jackquline: [phone]  - Dr. Claudene: [phone]  - Dra. Kitts: [phone]  En caso de inclemencias del [place], por favor llame a nuestra lnea principal al [phone] para una actualizacin sobre el estado de cualquier retraso o cierre.  Consejos para la medicacin en dermatologa: Por favor, guarde las cajas en las que vienen los medicamentos de uso tpico para ayudarle a seguir las instrucciones sobre dnde y cmo usarlos. Las farmacias generalmente imprimen las instrucciones del medicamento slo en las cajas y no directamente en los tubos del [place].   Si su medicamento es muy caro, por favor, pngase en contacto con landry rieger llamando al [phone] y presione la opcin 4 o envenos un mensaje a travs de Clinical cytogeneticist.   No podemos decirle cul ser su copago por los medicamentos por adelantado ya que esto es diferente dependiendo de la cobertura de su seguro. Sin embargo, es posible que podamos encontrar un medicamento sustituto a Audiological scientist un formulario para que el seguro cubra el medicamento que se considera necesario.   Si se requiere una autorizacin previa para que su compaa de seguros malta su medicamento, por favor permtanos de 1 a 2 das hbiles para completar este proceso.  Los precios de los medicamentos varan con frecuencia dependiendo del Environmental consultant de dnde se surte la receta y alguna farmacias pueden ofrecer precios ms baratos.  El sitio web www.goodrx.com tiene cupones para medicamentos de Health and safety inspector. Los precios aqu no tienen en cuenta lo que podra costar con la ayuda del seguro (puede ser ms barato con su seguro), pero el sitio web puede darle el precio si no utiliz Tourist information centre manager.  - Puede imprimir el cupn correspondiente y llevarlo con su receta a la farmacia.  - Tambin puede pasar por nuestra oficina  durante el horario de atencin regular y Education officer, museum una tarjeta de cupones de GoodRx.  - Si necesita que su receta se enve electrnicamente a una farmacia diferente, informe a nuestra oficina a travs de MyChart de [place] o por telfono llamando al [phone] y presione la opcin 4.

## 2024-03-16 NOTE — Progress Notes (Signed)
 New Patient Visit   Subjective  Ronald Valentine is a 83 y.o. male who presents for the following: Skin Cancer Screening and Full Body Skin Exam Patient recently moved to this area, here for a mole check, several spots of concern today.    The patient presents for Total-Body Skin Exam (TBSE) for skin cancer screening and mole check. The patient has spots, moles and lesions to be evaluated, some may be new or changing and the patient may have concern these could be cancer.    The following portions of the chart were reviewed this encounter and updated as appropriate: medications, allergies, medical history  Review of Systems:  No other skin or systemic complaints except as noted in HPI or Assessment and Plan.  Objective  Well appearing patient in no apparent distress; mood and affect are within normal limits.  A full examination was performed including scalp, head, eyes, ears, nose, lips, neck, chest, axillae, abdomen, back, buttocks, bilateral upper extremities, bilateral lower extremities, hands, feet, fingers, toes, fingernails, and toenails. All findings within normal limits unless otherwise noted below.   Relevant physical exam findings are noted in the Assessment and Plan.  left post auricular 0.9 cm pink pearly papule  left lateral lower extremity 1.1 cm hyperkeratotic plaque  left temple at 9 o'clock 0.9 cm pink pearly plaque  left temple at 3 o'clock 1.1 cm pink scaly plaque  face, left arm (9) Erythematous thin papules/macules with gritty scale.  right arm x 1 Stuck-on, waxy, tan-brown papule or plaque --Discussed benign etiology and prognosis.            Assessment & Plan   SKIN CANCER SCREENING PERFORMED TODAY.  ACTINIC DAMAGE - Chronic condition, secondary to cumulative UV/sun exposure - diffuse scaly erythematous macules with underlying dyspigmentation - Recommend daily broad spectrum sunscreen SPF 30+ to sun-exposed areas, reapply every 2 hours as  needed.  - Staying in the shade or wearing long sleeves, sun glasses (UVA+UVB protection) and wide brim hats (4-inch brim around the entire circumference of the hat) are also recommended for sun protection.  - Call for new or changing lesions.  LENTIGINES, SEBORRHEIC KERATOSES, HEMANGIOMAS - Benign normal skin lesions - Benign-appearing - Call for any changes  MELANOCYTIC NEVI - Tan-brown and/or pink-flesh-colored symmetric macules and papules - Benign appearing on exam today - Observation - Call clinic for new or changing moles - Recommend daily use of broad spectrum spf 30+ sunscreen to sun-exposed areas.    HISTORY OF BASAL CELL CARCINOMA OF THE SKIN - No evidence of recurrence today - Recommend regular full body skin exams - Recommend daily broad spectrum sunscreen SPF 30+ to sun-exposed areas, reapply every 2 hours as needed.  - Call if any new or changing lesions are noted between office visits    HISTORY OF SQUAMOUS CELL CARCINOMA OF THE SKIN - No evidence of recurrence today - No lymphadenopathy - Recommend regular full body skin exams - Recommend daily broad spectrum sunscreen SPF 30+ to sun-exposed areas, reapply every 2 hours as needed.  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 Anesthesia: the lesion was anesthetized in a standard fashion   Anesthetic:  1% lidocaine  w/ epinephrine 1-100,000 buffered w/ 8.4% NaHCO3  Instrument used: flexible razor blade   Hemostasis achieved with: pressure, aluminum chloride and electrodesiccation   Outcome: patient tolerated procedure well   Post-procedure details: wound care instructions given   Post-procedure details comment:  Ointment and small bandage  Specimen 1 - Surgical  pathology Differential Diagnosis: R/O BCC  Check Margins: No left lateral lower extremity Skin / nail biopsy Type of biopsy: tangential   Informed consent: discussed and consent obtained   Patient was prepped and draped in usual sterile fashion: area prepped with alochol. Anesthesia: the lesion was anesthetized in a standard fashion   Anesthetic:  1% lidocaine  w/ epinephrine 1-100,000 buffered w/ 8.4% NaHCO3 Instrument used: flexible razor blade   Hemostasis achieved with: pressure, aluminum chloride and electrodesiccation   Outcome: patient tolerated procedure well   Post-procedure details: wound care instructions given   Post-procedure details comment:  Ointment and small bandage  Specimen 2 - Surgical pathology Differential Diagnosis: R/O BCC vs SCC  Check Margins: No left temple at 9 o'clock Skin / nail biopsy Type of biopsy: tangential   Informed consent: discussed and consent obtained   Patient was prepped and draped in usual sterile fashion: area prepped with alochol. Anesthesia: the lesion was anesthetized in a standard fashion   Anesthetic:  1% lidocaine  w/ epinephrine 1-100,000 buffered w/ 8.4% NaHCO3 Instrument used: flexible razor blade   Hemostasis achieved with: pressure, aluminum chloride and electrodesiccation   Outcome: patient tolerated procedure well   Post-procedure details: wound care instructions given   Post-procedure details comment:  Ointment and small bandage  Specimen 3 - Surgical pathology Differential Diagnosis: R/O BCC vs SCC  Check Margins: No left temple at 3 o'clock Skin / nail biopsy Type of biopsy: tangential   Informed consent: discussed and consent obtained   Patient was prepped and draped in usual sterile fashion: area prepped with alochol. Anesthesia: the lesion was anesthetized in a standard fashion   Anesthetic:  1% lidocaine  w/ epinephrine 1-100,000 buffered w/ 8.4% NaHCO3 Instrument used: flexible razor blade   Hemostasis  achieved with: pressure, aluminum chloride and electrodesiccation   Outcome: patient tolerated procedure well   Post-procedure details: wound care instructions given   Post-procedure details comment:  Ointment and small bandage  Specimen 4 - Surgical pathology Differential Diagnosis: R/O BCC vs SCC  Check Margins: No AK (ACTINIC KERATOSIS) (9) face, left arm (9) ACTINIC DAMAGE - chronic, secondary to cumulative UV radiation exposure/sun exposure over time - diffuse scaly erythematous macules with underlying dyspigmentation - Recommend daily broad spectrum sunscreen SPF 30+ to sun-exposed areas, reapply every 2 hours as needed.  - Recommend staying in the shade or wearing long sleeves, sun glasses (UVA+UVB protection) and wide brim hats (4-inch brim around the entire circumference of the hat). - Call for new or changing lesions.  Destruction of lesion - face, left arm (9) Complexity: simple   Destruction method: cryotherapy   Informed consent: discussed and consent obtained   Timeout:  patient name, date of birth, surgical site, and procedure verified Lesion destroyed using liquid nitrogen: Yes   Region frozen until ice ball extended beyond lesion: Yes   Outcome: patient tolerated procedure well with no complications   Post-procedure details: wound care instructions given    INFLAMED SEBORRHEIC KERATOSIS right arm x 1 Symptomatic, irritating, patient would like treated.  Destruction of lesion - right arm x 1 Complexity: simple   Destruction method: cryotherapy   Informed consent: discussed and consent obtained   Timeout:  patient name, date of birth,  surgical site, and procedure verified Lesion destroyed using liquid nitrogen: Yes   Region frozen until ice ball extended beyond lesion: Yes   Outcome: patient tolerated procedure well with no complications   Post-procedure details: wound care instructions given     Return in about 4 months (around 07/16/2024) for TBSE, hx of skin  cancer.  IFay Kirks, CMA, am acting as scribe for Lauraine JAYSON Kanaris, MD .   Documentation: I have reviewed the above documentation for accuracy and completeness, and I agree with the above.  Lauraine JAYSON Kanaris, MD

## 2024-03-17 LAB — SURGICAL PATHOLOGY

## 2024-03-18 ENCOUNTER — Ambulatory Visit: Payer: Self-pay

## 2024-03-18 DIAGNOSIS — C44310 Basal cell carcinoma of skin of unspecified parts of face: Secondary | ICD-10-CM

## 2024-03-18 DIAGNOSIS — C4492 Squamous cell carcinoma of skin, unspecified: Secondary | ICD-10-CM

## 2024-03-23 MED ORDER — FLUOROURACIL 5 % EX CREA
TOPICAL_CREAM | CUTANEOUS | 0 refills | Status: AC
Start: 2024-03-23 — End: ?

## 2024-03-23 NOTE — Telephone Encounter (Signed)
 Patient's wife has been advised of BX results. She would like [facility] referral sent to [facility] due to patient has been here in the past and familiar with location. Referral sent, 5 FU sent in and spouse advised to follow up with me if patient has not been scheduled within two weeks.

## 2024-03-23 NOTE — Addendum Note (Signed)
 Addended by: TERESA PALMA R on: 03/23/2024 11:36 AM   Modules accepted: Orders

## 2024-03-23 NOTE — Addendum Note (Signed)
 Addended by: TERESA PALMA R on: 03/23/2024 03:14 PM   Modules accepted: Orders

## 2024-03-25 DIAGNOSIS — M170 Bilateral primary osteoarthritis of knee: Secondary | ICD-10-CM | POA: Diagnosis not present

## 2024-03-30 DIAGNOSIS — I484 Atypical atrial flutter: Secondary | ICD-10-CM | POA: Diagnosis not present

## 2024-03-30 DIAGNOSIS — E785 Hyperlipidemia, unspecified: Secondary | ICD-10-CM | POA: Diagnosis not present

## 2024-03-30 DIAGNOSIS — I350 Nonrheumatic aortic (valve) stenosis: Secondary | ICD-10-CM | POA: Diagnosis not present

## 2024-03-30 DIAGNOSIS — I10 Essential (primary) hypertension: Secondary | ICD-10-CM | POA: Diagnosis not present

## 2024-03-30 DIAGNOSIS — Z86718 Personal history of other venous thrombosis and embolism: Secondary | ICD-10-CM | POA: Diagnosis not present

## 2024-04-01 ENCOUNTER — Telehealth: Payer: Self-pay

## 2024-04-01 MED ORDER — CALCIPOTRIENE 0.005 % EX CREA: TOPICAL_CREAM | CUTANEOUS | 0 refills | Status: AC

## 2024-04-01 NOTE — Telephone Encounter (Signed)
 RX sent in and patient's spouse advised of pharmacy information. aw

## 2024-04-01 NOTE — Telephone Encounter (Signed)
 Patient's spouse called regarding 5FU Cream over $200 copay at local pharmacy.  Okay to send in 5FU/Calcipotriene  Cream to [facility] for $45 plus shipping? If so, what directions would you like for patient's leg?

## 2024-04-13 DIAGNOSIS — N1831 Chronic kidney disease, stage 3a: Secondary | ICD-10-CM | POA: Diagnosis not present

## 2024-04-13 DIAGNOSIS — E1165 Type 2 diabetes mellitus with hyperglycemia: Secondary | ICD-10-CM | POA: Diagnosis not present

## 2024-04-13 DIAGNOSIS — E7849 Other hyperlipidemia: Secondary | ICD-10-CM | POA: Diagnosis not present

## 2024-04-13 DIAGNOSIS — D559 Anemia due to enzyme disorder, unspecified: Secondary | ICD-10-CM | POA: Diagnosis not present

## 2024-04-13 DIAGNOSIS — E1122 Type 2 diabetes mellitus with diabetic chronic kidney disease: Secondary | ICD-10-CM | POA: Diagnosis not present

## 2024-04-20 DIAGNOSIS — I700 Atherosclerosis of aorta: Secondary | ICD-10-CM | POA: Diagnosis not present

## 2024-04-20 DIAGNOSIS — Z1211 Encounter for screening for malignant neoplasm of colon: Secondary | ICD-10-CM | POA: Diagnosis not present

## 2024-04-20 DIAGNOSIS — Z0001 Encounter for general adult medical examination with abnormal findings: Secondary | ICD-10-CM | POA: Diagnosis not present

## 2024-04-20 DIAGNOSIS — D692 Other nonthrombocytopenic purpura: Secondary | ICD-10-CM | POA: Diagnosis not present

## 2024-04-20 DIAGNOSIS — I82409 Acute embolism and thrombosis of unspecified deep veins of unspecified lower extremity: Secondary | ICD-10-CM | POA: Diagnosis not present

## 2024-04-23 DIAGNOSIS — D0472 Carcinoma in situ of skin of left lower limb, including hip: Secondary | ICD-10-CM | POA: Diagnosis not present

## 2024-04-23 DIAGNOSIS — C4441 Basal cell carcinoma of skin of scalp and neck: Secondary | ICD-10-CM | POA: Diagnosis not present

## 2024-05-05 ENCOUNTER — Telehealth: Payer: Self-pay

## 2024-05-05 NOTE — Telephone Encounter (Signed)
 Updated speicmen tracking and history.  MOHs completed L postauricular by Dr. Robina, [facility] on 05/05/2024. Aw

## 2024-05-06 DIAGNOSIS — H524 Presbyopia: Secondary | ICD-10-CM | POA: Diagnosis not present

## 2024-05-06 DIAGNOSIS — H35361 Drusen (degenerative) of macula, right eye: Secondary | ICD-10-CM | POA: Diagnosis not present

## 2024-05-12 DIAGNOSIS — Z23 Encounter for immunization: Secondary | ICD-10-CM | POA: Diagnosis not present

## 2024-05-21 DIAGNOSIS — C44319 Basal cell carcinoma of skin of other parts of face: Secondary | ICD-10-CM | POA: Diagnosis not present

## 2024-05-21 DIAGNOSIS — C44329 Squamous cell carcinoma of skin of other parts of face: Secondary | ICD-10-CM | POA: Diagnosis not present

## 2024-06-02 ENCOUNTER — Other Ambulatory Visit: Payer: Self-pay

## 2024-06-02 ENCOUNTER — Telehealth: Payer: Self-pay

## 2024-06-02 NOTE — Progress Notes (Signed)
 ERROR

## 2024-06-02 NOTE — Telephone Encounter (Signed)
 Specimen tracking updated. Mohs.

## 2024-06-28 DIAGNOSIS — N400 Enlarged prostate without lower urinary tract symptoms: Secondary | ICD-10-CM | POA: Insufficient documentation

## 2024-06-28 NOTE — Progress Notes (Unsigned)
 07/01/24 10:50 AM   Ronald Valentine 1941-05-08 [id number]   HPI: 83 y.o. male here for initial evaluation of BPH  He has been followed by a urologist at [facility] for many years, for microhematuria, BPH, nephrolithiasis  - Unfortunately he did not bring or fax any of his outside records  - Reportedly had prior TURP, prior stone surgeries, unclear of prior workup for microhematuria  Current or prior therapies:   - on Flomax monotherapy   - previously on finasteride, discontinued (symptoms worsened?)  Denies GH, UTIs, nephrolithiasis, prostatitis Denies Fhx of GU malignancies   Hx of aortic valvle stenosis On Eliquis    PMH: 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   Left Temple (curet and 5FU)   BCC (basal cell carcinoma of skin) 01/13/2019   Right Side Nose (MOH's)   BCC (basal cell carcinoma) 03/16/2024   L post auricular, MOHs completed by Dr. Robina ([facility])   [facility] (basal cell carcinoma) 03/16/2024   L temple 3 o'clock, MOHs   Colon polyp    GERD (gastroesophageal reflux disease)    Hypertension    Kidney stones    Left leg DVT (HCC)    Pulmonary embolism, bilateral (HCC)    SCC (squamous cell carcinoma) 03/16/2024   L lateral lower extremity, treating with 5FU   SCC (squamous cell carcinoma) 03/16/2024   L temple, 9 o'clock, Mohs 05/21/2024   SCCA (squamous cell carcinoma) of skin 01/13/2019   Tip of Nose (in situ) (MOH's)   SCCA (squamous cell carcinoma) of skin 01/13/2019   Above Right Brow (in situ) (curet, cautery, and 5FU)   Superficial basal cell carcinoma (BCC) 10/19/2015   Right Post Shoulder (curet and 5FU)   Superficial basal cell carcinoma (BCC) 10/19/2015   Left Temple (curet and 5FU)   Superficial basal cell carcinoma (BCC) 10/19/2015   Right Temple (curet and 5FU)   Superficial basal cell carcinoma (BCC) 10/09/2016   Right Temple (curet and 5FU)    Surgical  History: Past Surgical History:  Procedure Laterality Date   BIOPSY  02/20/2017   Procedure: BIOPSY;  Surgeon: Golda Claudis PENNER, MD;  [facility]: [facility];  Service: Endoscopy;;  duodenal   COLONOSCOPY     COLONOSCOPY  10/17/2011   Procedure: COLONOSCOPY;  Surgeon: Claudis PENNER Golda, MD;  Location: [facility];  Service: Endoscopy;  Laterality: N/A;  930   COLONOSCOPY N/A 10/06/2019   Procedure: COLONOSCOPY;  Surgeon: Golda Claudis PENNER, MD;  [facility]: [facility];  Service: Endoscopy;  Laterality: N/A;   ESOPHAGOGASTRODUODENOSCOPY  12/26/2011   Procedure: ESOPHAGOGASTRODUODENOSCOPY (EGD);  Surgeon: Claudis PENNER Golda, MD;  [facility]: [facility];  Service: Endoscopy;  Laterality: N/A;  730   ESOPHAGOGASTRODUODENOSCOPY N/A 02/20/2017   Procedure: ESOPHAGOGASTRODUODENOSCOPY (EGD);  Surgeon: Golda Claudis PENNER, MD;  [facility]: [facility];  Service: Endoscopy;  Laterality: N/A;  2:20   ESOPHAGOGASTRODUODENOSCOPY N/A 10/06/2019   Procedure: ESOPHAGOGASTRODUODENOSCOPY (EGD);  Surgeon: Golda Claudis PENNER, MD;  [facility]: [facility];  Service: Endoscopy;  Laterality: N/A;  2:00-moved to 3/10 @ 1200 per office   LITHOTRIPSY     POLYPECTOMY  10/06/2019   Procedure: POLYPECTOMY;  Surgeon: Golda Claudis PENNER, MD;  Location: [facility];  Service: Endoscopy;;  colon    Family History: Family History  Problem Relation Age of Onset   Hypertension Mother    Colon cancer Neg Hx  Social History:  reports that he has never smoked. He has never used smokeless tobacco. He reports that he does not drink alcohol  and does not use drugs.      Physical Exam: BP (!) 154/82   Pulse (!) 103   Ht 6' 2 (1.88 m)   Wt 196 lb (88.9 kg)   BMI 25.16 kg/m    Constitutional:  Alert and oriented, No acute distress. Cardiovascular: No clubbing, cyanosis, or edema. Respiratory: Normal respiratory effort, no increased work of breathing. GI: Nondistended Skin: No rashes, bruises or suspicious lesions. Neurologic:  Grossly intact, no focal deficits, moving all 4 extremities. Psychiatric: Normal mood and affect.  Laboratory Data: Component Ref Range & Units 6 yr ago  PSA 0.01 - 4.00 NG/ML 1.94     Pertinent Imaging: N/A    Assessment & Plan:    Benign prostatic hyperplasia, unspecified whether lower urinary tract symptoms present Assessment & Plan: Reportedly prior TURP, unknown time frame On Flomax monotherapy Discontinued finasteride  IPSS 29/5 PVR 46cc today  Today we reviewed the physiology and common causes of male lower urinary tract symptoms (LUTS). Discussed potential etiologies including infectious, inflammatory, bladder-related, benign prostatic hyperplasia (BPH), and musculoskeletal/pelvic floor contributions. Reviewed the standard diagnostic workup (urinalysis, PVR, uroflow, prostate assessment, possible cystoscopy or imaging) and the spectrum of initial management strategies ranging from behavioral and lifestyle measures to pharmacologic therapy, with procedural options if indicated. All questions were addressed and the patient expressed understanding of the evaluation and treatment pathway.  -Encouraged him to collect his outside urology records spanning several years from [facility].  I am happy to help him once I am able to read his prior procedures, imaging and workup   Orders: -     Urinalysis, Complete -     Bladder Scan (Post Void Residual) in office  Microhematuria Assessment & Plan: Isolated >30 RBC on UA today History of BPH, nephrolithiasis, prior TURP On Eliquis  Unclear if this has been documented or thoroughly evaluated from his outside urologist.  As above, it would be helpful for him to fax outside records.  Otherwise, I would need to presume no workup and proceed with CT urogram and office cystoscopy.        Penne Skye, MD 07/01/2024  [facility] [address] [place], [place] [address] [phone]

## 2024-06-28 NOTE — Assessment & Plan Note (Signed)
 Reportedly prior TURP, unknown time frame On Flomax monotherapy Discontinued finasteride  IPSS 29/5 PVR 46cc today  Today we reviewed the physiology and common causes of male lower urinary tract symptoms (LUTS). Discussed potential etiologies including infectious, inflammatory, bladder-related, benign prostatic hyperplasia (BPH), and musculoskeletal/pelvic floor contributions. Reviewed the standard diagnostic workup (urinalysis, PVR, uroflow, prostate assessment, possible cystoscopy or imaging) and the spectrum of initial management strategies ranging from behavioral and lifestyle measures to pharmacologic therapy, with procedural options if indicated. All questions were addressed and the patient expressed understanding of the evaluation and treatment pathway.  -Encouraged him to collect his outside urology records spanning several years from [facility].  I am happy to help him once I am able to read his prior procedures, imaging and workup

## 2024-07-01 ENCOUNTER — Ambulatory Visit: Admitting: Urology

## 2024-07-01 VITALS — BP 154/82 | HR 103 | Ht 74.0 in | Wt 196.0 lb

## 2024-07-01 DIAGNOSIS — R3129 Other microscopic hematuria: Secondary | ICD-10-CM | POA: Insufficient documentation

## 2024-07-01 DIAGNOSIS — N400 Enlarged prostate without lower urinary tract symptoms: Secondary | ICD-10-CM | POA: Diagnosis not present

## 2024-07-01 LAB — URINALYSIS, COMPLETE
Bilirubin, UA: NEGATIVE
Glucose, UA: NEGATIVE
Leukocytes,UA: NEGATIVE
Nitrite, UA: NEGATIVE
Protein,UA: NEGATIVE
Specific Gravity, UA: 1.025 (ref 1.005–1.030)
Urobilinogen, Ur: 0.2 mg/dL (ref 0.2–1.0)
pH, UA: 6 (ref 5.0–7.5)

## 2024-07-01 LAB — MICROSCOPIC EXAMINATION: RBC, Urine: >30 /HPF — AB (ref 0–2)

## 2024-07-01 LAB — BLADDER SCAN AMB NON-IMAGING: Scan Result: 46

## 2024-07-01 NOTE — Assessment & Plan Note (Signed)
 Isolated >30 RBC on UA today History of BPH, nephrolithiasis, prior TURP On Eliquis  Unclear if this has been documented or thoroughly evaluated from his outside urologist.  As above, it would be helpful for him to fax outside records.  Otherwise, I would need to presume no workup and proceed with CT urogram and office cystoscopy.

## 2024-07-05 ENCOUNTER — Telehealth: Payer: Self-pay

## 2024-07-05 NOTE — Telephone Encounter (Signed)
 Please see Mohs report scanned into media. Thank you.

## 2024-07-12 ENCOUNTER — Ambulatory Visit

## 2024-07-12 DIAGNOSIS — L821 Other seborrheic keratosis: Secondary | ICD-10-CM

## 2024-07-12 DIAGNOSIS — Z85820 Personal history of malignant melanoma of skin: Secondary | ICD-10-CM

## 2024-07-12 DIAGNOSIS — L814 Other melanin hyperpigmentation: Secondary | ICD-10-CM

## 2024-07-12 DIAGNOSIS — Z1283 Encounter for screening for malignant neoplasm of skin: Secondary | ICD-10-CM

## 2024-07-12 DIAGNOSIS — L578 Other skin changes due to chronic exposure to nonionizing radiation: Secondary | ICD-10-CM

## 2024-07-12 DIAGNOSIS — D1801 Hemangioma of skin and subcutaneous tissue: Secondary | ICD-10-CM

## 2024-07-12 DIAGNOSIS — D489 Neoplasm of uncertain behavior, unspecified: Secondary | ICD-10-CM

## 2024-07-12 DIAGNOSIS — L570 Actinic keratosis: Secondary | ICD-10-CM

## 2024-07-12 DIAGNOSIS — D229 Melanocytic nevi, unspecified: Secondary | ICD-10-CM

## 2024-07-12 DIAGNOSIS — C4490 Unspecified malignant neoplasm of skin, unspecified: Secondary | ICD-10-CM

## 2024-07-12 NOTE — Patient Instructions (Signed)
 Start Efudex  chemo cream on the left lower leg at biopsy site. Apply twice daily for 4-6 weeks or until reaction occurs.   Wound Care Instructions  Cleanse wound gently with soap and water  once a day then pat dry with clean gauze. Apply a thin coat of Petrolatum (petroleum jelly, Vaseline) over the wound (unless you have an allergy to this). We recommend that you use a new, sterile tube of Vaseline. Do not pick or remove scabs. Do not remove the yellow or white healing tissue from the base of the wound.  Cover the wound with fresh, clean, nonstick gauze and secure with paper tape. You may use Band-Aids in place of gauze and tape if the wound is small enough, but would recommend trimming much of the tape off as there is often too much. Sometimes Band-Aids can irritate the skin.  You should call the office for your biopsy report after 1 week if you have not already been contacted.  If you experience any problems, such as abnormal amounts of bleeding, swelling, significant bruising, significant pain, or evidence of infection, please call the office immediately.  FOR ADULT SURGERY PATIENTS: If you need something for pain relief you may take 1 extra strength Tylenol (acetaminophen) AND 2 Ibuprofen (200mg  each) together every 4 hours as needed for pain. (do not take these if you are allergic to them or if you have a reason you should not take them.) Typically, you may only need pain medication for 1 to 3 days.    Cryotherapy Aftercare  Wash gently with soap and water  everyday.   Apply Vaseline and Band-Aid daily until healed.   Recommend daily broad spectrum sunscreen SPF 30+ to sun-exposed areas, reapply every 2 hours as needed. Call for new or changing lesions.  Staying in the shade or wearing long sleeves, sun glasses (UVA+UVB protection) and wide brim hats (4-inch brim around the entire circumference of the hat) are also recommended for sun protection.   Due to recent changes in healthcare  laws, you may see results of your pathology and/or laboratory studies on MyChart before the doctors have had a chance to review them. We understand that in some cases there may be results that are confusing or concerning to you. Please understand that not all results are received at the same time and often the doctors may need to interpret multiple results in order to provide you with the best plan of care or course of treatment. Therefore, we ask that you please give us  2 business days to thoroughly review all your results before contacting the office for clarification. Should we see a critical lab result, you will be contacted sooner.   If You Need Anything After Your Visit  If you have any questions or concerns for your doctor, please call our main line at [phone] and press option 4 to reach your doctor's medical assistant. If no one answers, please leave a voicemail as directed and we will return your call as soon as possible. Messages left after 4 pm will be answered the following business day.   You may also send us  a message via MyChart. We typically respond to MyChart messages within 1-2 business days.  For prescription refills, please ask your pharmacy to contact our office. Our fax number is [phone].  If you have an urgent issue when the clinic is closed that cannot wait until the next business day, you can page your doctor at the number below.    Please note that while  we do our best to be available for urgent issues outside of office hours, we are not available 24/7.   If you have an urgent issue and are unable to reach us , you may choose to seek medical care at your doctor's office, retail clinic, urgent care center, or emergency room.  If you have a medical emergency, please immediately call 911 or go to the emergency department.  Pager Numbers  - Dr. Hester: [phone]  - Dr. Jackquline: [phone]  - Dr. Claudene: [phone]   - Dr. Raymund: [phone]  In the  event of inclement weather, please call our main line at [phone] for an update on the status of any delays or closures.  Dermatology Medication Tips: Please keep the boxes that topical medications come in in order to help keep track of the instructions about where and how to use these. Pharmacies typically print the medication instructions only on the boxes and not directly on the medication tubes.   If your medication is too expensive, please contact our office at [phone] option 4 or send us  a message through MyChart.   We are unable to tell what your co-pay for medications will be in advance as this is different depending on your insurance coverage. However, we may be able to find a substitute medication at lower cost or fill out paperwork to get insurance to cover a needed medication.   If a prior authorization is required to get your medication covered by your insurance company, please allow us  1-2 business days to complete this process.  Drug prices often vary depending on where the prescription is filled and some pharmacies may offer cheaper prices.  The website www.goodrx.com contains coupons for medications through different pharmacies. The prices here do not account for what the cost may be with help from insurance (it may be cheaper with your insurance), but the website can give you the price if you did not use any insurance.  - You can print the associated coupon and take it with your prescription to the pharmacy.  - You may also stop by our office during regular business hours and pick up a GoodRx coupon card.  - If you need your prescription sent electronically to a different pharmacy, notify our office through [facility] or by phone at [phone] option 4.     Si Usted Necesita Algo Despus de Su Visita  Tambin puede enviarnos un mensaje a travs de Clinical Cytogeneticist. Por lo general respondemos a los mensajes de MyChart en el transcurso de 1 a 2 das hbiles.  Para  renovar recetas, por favor pida a su farmacia que se ponga en contacto con nuestra oficina. Randi lakes de fax es [place] [phone].  Si tiene un asunto urgente cuando la clnica est cerrada y que no puede esperar hasta el siguiente da hbil, puede llamar/localizar a su doctor(a) al nmero que aparece a continuacin.   Por favor, tenga en cuenta que aunque hacemos todo lo posible para estar disponibles para asuntos urgentes fuera del horario de [place], no estamos disponibles las 24 horas del da, los 7 [address]  [address] de la [place].   Si tiene un problema urgente y no puede comunicarse con nosotros, puede optar por buscar atencin mdica  en el consultorio de su doctor(a), en una clnica privada, en un centro de atencin urgente o en una sala de emergencias.  Si tiene engineer, drilling, por favor llame inmediatamente al 911 o vaya a la sala de emergencias.  Nmeros de bper  -  Dr. Hester: [phone]  - Dra. Jackquline: [phone]  - Dr. Claudene: [phone]  - Dra. Kitts: [phone]  En caso de inclemencias del [place], por favor llame a nuestra lnea principal al [phone] para una actualizacin sobre el estado de cualquier retraso o cierre.  Consejos para la medicacin en dermatologa: Por favor, guarde las cajas en las que vienen los medicamentos de uso tpico para ayudarle a seguir las instrucciones sobre dnde y cmo usarlos. Las farmacias generalmente imprimen las instrucciones del medicamento slo en las cajas y no directamente en los tubos del [place].   Si su medicamento es muy caro, por favor, pngase en contacto con landry rieger llamando al [phone] y presione la opcin 4 o envenos un mensaje a travs de Clinical Cytogeneticist.   No podemos decirle cul ser su copago por los medicamentos por adelantado ya que esto es diferente dependiendo de la cobertura de su seguro. Sin embargo, es posible que podamos encontrar un medicamento sustituto a audiological scientist un formulario para  que el seguro cubra el medicamento que se considera necesario.   Si se requiere una autorizacin previa para que su compaa de seguros cubra su medicamento, por favor permtanos de 1 a 2 das hbiles para completar este proceso.  Los precios de los medicamentos varan con frecuencia dependiendo del environmental consultant de dnde se surte la receta y alguna farmacias pueden ofrecer precios ms baratos.  El sitio web www.goodrx.com tiene cupones para medicamentos de health and safety inspector. Los precios aqu no tienen en cuenta lo que podra costar con la ayuda del seguro (puede ser ms barato con su seguro), pero el sitio web puede darle el precio si no utiliz tourist information centre manager.  - Puede imprimir el cupn correspondiente y llevarlo con su receta a la farmacia.  - Tambin puede pasar por nuestra oficina durante el horario de atencin regular y education officer, museum una tarjeta de cupones de GoodRx.  - Si necesita que su receta se enve electrnicamente a una farmacia diferente, informe a nuestra oficina a travs de MyChart de [place] o por telfono llamando al [phone] y presione la opcin 4.

## 2024-07-12 NOTE — Progress Notes (Signed)
 Subjective   Ronald Valentine is a 83 y.o. male who presents for the following: Total body skin exam for skin cancer screening and mole check. The patient has spots, moles and lesions to be evaluated, some may be new or changing and the patient may have concern these could be cancer.. Patient is established patient   Today patient reports: Area of concern on the scalp Areas of concern on left lower extremity  Review of Systems:    No other skin or systemic complaints except as noted in HPI or Assessment and Plan.  The following portions of the chart were reviewed this encounter and updated as appropriate: medications, allergies, medical history  Relevant Medical History:  Personal history of non melanoma skin cancer - see medical history for full details   Objective  (SKPE) Well appearing patient in no apparent distress; mood and affect are within normal limits. Examination was performed of the: Full Skin Examination: scalp, head, eyes, ears, nose, lips, neck, chest, axillae, abdomen, back, buttocks, bilateral upper extremities, bilateral lower extremities, hands, feet, fingers, toes, fingernails, and toenails.   Examination notable for: SKIN EXAM, Angioma(s): Scattered red vascular papule(s)  , Lentigo/lentigines: Scattered pigmented macules that are tan to brown in color and are somewhat non-uniform in shape and concentrated in the sun-exposed areas, Nevus/nevi: Scattered well-demarcated, regular, pigmented macule(s) and/or papule(s)  , Seborrheic Keratosis(es): Stuck-on appearing keratotic papule(s) on the trunk, none  irritated with redness, crusting, edema, and/or partial avulsion, Actinic Damage/Elastosis: chronic sun damage: dyspigmentation, telangiectasia, and wrinkling, Actinic keratosis: Scaly erythematous macule(s) concentrated on sun exposed areas  left lateral lower extremity with scaly plaque Examination limited by: Undergarments and Patient deferred removal     Right  Postauricular Area 1cm pink ulcerated plaque  face, scalp, ears (13) Pink scaly macules  Assessment & Plan  (SKAP)   SKIN CANCER SCREENING PERFORMED TODAY.  BENIGN SKIN FINDINGS  - Lentigines  - Seborrheic keratoses  - Hemangiomas   - Nevus/Multiple Benign Nevi  - Reassurance provided regarding the benign appearance of lesions noted on exam today; no treatment is indicated in the absence of symptoms/changes. - Reinforced importance of photoprotective strategies including liberal and frequent sunscreen use of a broad-spectrum SPF 30 or greater, use of protective clothing, and sun avoidance for prevention of cutaneous malignancy and photoaging.  Counseled patient on the importance of regular self-skin monitoring as well as routine clinical skin examinations as scheduled.   ACTINIC DAMAGE - Chronic condition, secondary to cumulative UV/sun exposure - Recommend daily broad spectrum sunscreen SPF 30+ to sun-exposed areas, reapply every 2 hours as needed.  - Staying in the shade or wearing long sleeves, sun glasses (UVA+UVB protection) and wide brim hats (4-inch brim around the entire circumference of the hat) are also recommended for sun protection.  - Call for new or changing lesions.  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 Advised to hold treatment if developing side effects.  - Wash hands after use - Advised sun protection and avoidance   Was sun protection counseling provided?: Yes   Level of service outlined above   Patient instructions (SKPI)   Procedures, orders, diagnosis for this visit:  NEOPLASM OF UNCERTAIN BEHAVIOR Right Postauricular Area - Skin / nail biopsy Type of biopsy: tangential   Informed consent: discussed and consent obtained   Timeout: patient name, date of birth, surgical site, and procedure verified   Procedure prep:  Patient was prepped and draped in usual sterile fashion Prep type:  Isopropyl alcohol  Anesthesia: the lesion was anesthetized in a standard fashion   Anesthetic:  1% lidocaine  w/ epinephrine 1-100,000 buffered w/ 8.4% NaHCO3 Instrument used: DermaBlade   Hemostasis achieved with: pressure and aluminum chloride   Outcome: patient tolerated procedure well   Post-procedure details: sterile dressing applied and wound care instructions given   Dressing type: bandage and petrolatum    Specimen 1 - Surgical pathology Differential Diagnosis: BCC vs other   Check Margins: No ACTINIC KERATOSIS (13) face, scalp, ears (13) Actinic keratoses are precancerous spots that appear secondary to cumulative UV radiation exposure/sun exposure over time. They are chronic with expected duration over 1 year. A portion of actinic keratoses will progress to squamous cell carcinoma of the skin. It is not possible to reliably predict which spots will progress to skin cancer and so treatment is recommended to prevent development of skin cancer.  Recommend daily broad spectrum sunscreen SPF 30+ to sun-exposed areas, reapply every 2 hours as needed.  Recommend staying in the shade or wearing long sleeves, sun glasses (UVA+UVB protection) and wide brim hats (4-inch brim around the entire circumference of the hat). Call for new or changing lesions. -  Destruction of lesion - face, scalp, ears (13) Complexity: simple   Destruction method: cryotherapy   Informed consent: discussed and consent obtained   Timeout:  patient name, date of birth, surgical site, and procedure verified Lesion destroyed using liquid nitrogen: Yes   Region frozen until ice ball extended beyond lesion: Yes   Cryo cycles: 1 or 2. Outcome: patient tolerated procedure well with no complications   Post-procedure details: wound care instructions given     Neoplasm of uncertain behavior -     Skin / nail biopsy -     Surgical pathology; Standing  Actinic keratosis -     Destruction of lesion    Return to clinic: Return in about 6 months (around 01/10/2025) for TBSE, 6-8 weeks for recheck of lefty lower leg.  I, Emerick Ege, CMA am acting as scribe for Lauraine JAYSON Kanaris, MD.   Documentation: I have reviewed the above documentation for accuracy and completeness, and I agree with the above.  Lauraine JAYSON Kanaris, MD

## 2024-07-13 LAB — SURGICAL PATHOLOGY

## 2024-07-14 ENCOUNTER — Ambulatory Visit: Payer: Self-pay

## 2024-07-14 DIAGNOSIS — C44319 Basal cell carcinoma of skin of other parts of face: Secondary | ICD-10-CM

## 2024-07-14 NOTE — Telephone Encounter (Signed)
Left message on voicemail to return my call.  

## 2024-07-14 NOTE — Telephone Encounter (Signed)
-----   Message from Lauraine Kanaris, MD sent at 07/14/2024  2:40 PM EST ----- 1. Skin, right postauricular area :       BASAL CELL CARCINOMA, NODULAR PATTERN   Please notify patient with below plan: Mohs

## 2024-07-19 NOTE — Telephone Encounter (Signed)
 Referral e-mailed to [email or web address] and [email or web address]

## 2024-07-19 NOTE — Telephone Encounter (Signed)
-----   Message from Lauraine Kanaris, MD sent at 07/14/2024  2:40 PM EST ----- 1. Skin, right postauricular area :       BASAL CELL CARCINOMA, NODULAR PATTERN   Please notify patient with below plan: Mohs

## 2024-07-19 NOTE — Addendum Note (Signed)
 Addended by: WILLMA KNEE A on: 07/19/2024 11:31 AM   Modules accepted: Orders

## 2024-07-19 NOTE — Telephone Encounter (Signed)
 Discussed pathology results with patient's wife at his request. Bruna requests that referral be sent to [facility] since patient has an existing care relationship with their office and knows where the office is located.

## 2024-08-03 NOTE — Progress Notes (Signed)
" ° °  08/05/2024 10:26 AM   Ronald Valentine 1941/05/21 [id number]  Reason for visit: Follow up BPH, AMH   HPI: 84 y.o. male, follow up with me today  Reviewed outside scanned records from [facility] u[facility]   - TUIP in 2011  - hx of chronic AMH, bilateral nephrolithiasis, mildly complex Left renal cyst  - prior cysto/cytology in 2017 - negative   - cysto/cytology 2019 - negative   - RBUS (May 2025) - bilateral renal stones, stable mildly complex Left renal cyst  - last visit in July 2025 - nocturia felt to be secondary to BLE and fluid shifts  Prior HPI: He has been followed by a urologist at [facility] for many years, for microhematuria, BPH, nephrolithiasis  - Unfortunately he did not bring or fax any of his outside records  - Reportedly had prior TURP, prior stone surgeries, unclear of prior workup for microhematuria   Current or prior therapies:   - on Flomax monotherapy   - previously on finasteride, discontinued (symptoms worsened?)   Denies GH, UTIs, nephrolithiasis, prostatitis Denies Fhx of GU malignancies    Hx of aortic valvle stenosis On Eliquis    Physical Exam: BP (!) 171/87   Pulse 84   Ht 6' 2 (1.88 m)   Wt 198 lb (89.8 kg)   SpO2 95%   BMI 25.42 kg/m    Constitutional:  Alert and oriented, No acute distress.  Laboratory Data: Component Ref Range & Units 6 yr ago  PSA 0.01 - 4.00 NG/ML 1.94    Pertinent Imaging: N/A    Assessment & Plan:    Microhematuria Assessment & Plan: Reportedly chronic AMH  - s/p negative workup in 2017/2019   - RBUS (May 2025) - bilateral nonobstructive renal stones, stable mildly complex Left renal cyst   - on Eliquis   Reviewed his outside records which helped elucidate his history. It does seem he has had close follow up and prior negative workups for his AMH, along with a reassuring RBUS from earlier this year. I think it would be reasonable to follow expectant management for [facility] at this time and not repeat  aggressive workup, provided no new worrisome clinical changes or development of gross hematuria.    Benign prostatic hyperplasia, unspecified whether lower urinary tract symptoms present Assessment & Plan: Chronic BPH  - s/p prior TUIP in 2011  - previously on finasteride   - on Flomax   - bothersome nocturia, hx of OSA, hx of chronic BLE  - Offered restarting finasteride-patient had a poor experience with this medication and poor efficacy -Also offered a trial of trospium  nightly-he was interested in this.  Will start 20 mg nightly -Additionally, reemphasized attention to reduction in nighttime fluids.  He continues to drink up until bedtime including [facility] every evening-this is certainly contributing to his nocturia which is his most bothersome complaint   Other orders -     Trospium  Chloride; Take 1 tablet (20 mg total) by mouth at bedtime.  Dispense: 30 tablet; Refill: 5       Penne JONELLE Skye, MD  [facility] [address], [address] [place], [place] [address] [phone] "

## 2024-08-03 NOTE — Assessment & Plan Note (Signed)
 Reportedly chronic AMH  - s/p negative workup in 2017/2019   - RBUS (May 2025) - bilateral nonobstructive renal stones, stable mildly complex Left renal cyst   - on Eliquis   Reviewed his outside records which helped elucidate his history. It does seem he has had close follow up and prior negative workups for his AMH, along with a reassuring RBUS from earlier this year. I think it would be reasonable to follow expectant management for [facility] at this time and not repeat aggressive workup, provided no new worrisome clinical changes or development of gross hematuria.

## 2024-08-03 NOTE — Assessment & Plan Note (Addendum)
 Chronic BPH  - s/p prior TUIP in 2011  - previously on finasteride   - on Flomax   - bothersome nocturia, hx of OSA, hx of chronic BLE  - Offered restarting finasteride-patient had a poor experience with this medication and poor efficacy -Also offered a trial of trospium  nightly-he was interested in this.  Will start 20 mg nightly -Additionally, reemphasized attention to reduction in nighttime fluids.  He continues to drink up until bedtime including [facility] every evening-this is certainly contributing to his nocturia which is his most bothersome complaint

## 2024-08-05 ENCOUNTER — Ambulatory Visit: Admitting: Urology

## 2024-08-05 VITALS — BP 171/87 | HR 84 | Ht 74.0 in | Wt 198.0 lb

## 2024-08-05 DIAGNOSIS — N400 Enlarged prostate without lower urinary tract symptoms: Secondary | ICD-10-CM

## 2024-08-05 DIAGNOSIS — R3129 Other microscopic hematuria: Secondary | ICD-10-CM

## 2024-08-05 MED ORDER — TROSPIUM CHLORIDE 20 MG PO TABS: 20.0000 mg | ORAL_TABLET | Freq: Every day | ORAL | 5 refills | Status: AC

## 2024-08-09 ENCOUNTER — Telehealth: Payer: Self-pay

## 2024-08-09 NOTE — Telephone Encounter (Signed)
Specimen tracking and history updated from [facility] progress notes. aw 

## 2024-08-30 ENCOUNTER — Ambulatory Visit

## 2025-04-06 ENCOUNTER — Ambulatory Visit: Admitting: Urology
